# Patient Record
Sex: Female | Born: 1985
Health system: Southern US, Community
[De-identification: ages and names within clinical notes are randomized; demographics above are authoritative.]

## PROBLEM LIST (undated history)

## (undated) DIAGNOSIS — N83209 Unspecified ovarian cyst, unspecified side: Secondary | ICD-10-CM

## (undated) DIAGNOSIS — Z789 Other specified health status: Secondary | ICD-10-CM

## (undated) DIAGNOSIS — O039 Complete or unspecified spontaneous abortion without complication: Secondary | ICD-10-CM

## (undated) HISTORY — PX: BREAST BIOPSY: SHX20

---
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
## 2004-06-01 ENCOUNTER — Inpatient Hospital Stay (HOSPITAL_COMMUNITY): Admission: AD | Admit: 2004-06-01 | Discharge: 2004-06-02 | Payer: Self-pay | Admitting: Obstetrics and Gynecology

## 2004-08-07 ENCOUNTER — Other Ambulatory Visit: Admission: RE | Admit: 2004-08-07 | Discharge: 2004-08-07 | Payer: Self-pay | Admitting: Obstetrics and Gynecology

## 2004-08-21 ENCOUNTER — Ambulatory Visit (HOSPITAL_COMMUNITY): Admission: RE | Admit: 2004-08-21 | Discharge: 2004-08-21 | Payer: Self-pay | Admitting: Obstetrics and Gynecology

## 2004-10-03 ENCOUNTER — Inpatient Hospital Stay (HOSPITAL_COMMUNITY): Admission: AD | Admit: 2004-10-03 | Discharge: 2004-10-03 | Payer: Self-pay | Admitting: Obstetrics and Gynecology

## 2004-12-23 ENCOUNTER — Inpatient Hospital Stay (HOSPITAL_COMMUNITY): Admission: AD | Admit: 2004-12-23 | Discharge: 2004-12-26 | Payer: Self-pay | Admitting: Obstetrics and Gynecology

## 2004-12-28 ENCOUNTER — Ambulatory Visit: Admission: RE | Admit: 2004-12-28 | Discharge: 2004-12-28 | Payer: Self-pay | Admitting: Obstetrics and Gynecology

## 2006-01-21 ENCOUNTER — Emergency Department (HOSPITAL_COMMUNITY): Admission: EM | Admit: 2006-01-21 | Discharge: 2006-01-21 | Payer: Self-pay | Admitting: Family Medicine

## 2006-09-11 ENCOUNTER — Emergency Department (HOSPITAL_COMMUNITY): Admission: EM | Admit: 2006-09-11 | Discharge: 2006-09-11 | Payer: Self-pay | Admitting: Emergency Medicine

## 2006-12-19 ENCOUNTER — Emergency Department (HOSPITAL_COMMUNITY): Admission: EM | Admit: 2006-12-19 | Discharge: 2006-12-19 | Payer: Self-pay | Admitting: Emergency Medicine

## 2007-02-17 ENCOUNTER — Emergency Department (HOSPITAL_COMMUNITY): Admission: EM | Admit: 2007-02-17 | Discharge: 2007-02-17 | Payer: Self-pay | Admitting: Family Medicine

## 2009-02-04 ENCOUNTER — Inpatient Hospital Stay (HOSPITAL_COMMUNITY): Admission: AD | Admit: 2009-02-04 | Discharge: 2009-02-04 | Payer: Self-pay | Admitting: Obstetrics & Gynecology

## 2009-07-11 ENCOUNTER — Encounter: Admission: RE | Admit: 2009-07-11 | Discharge: 2009-07-11 | Payer: Self-pay | Admitting: Obstetrics and Gynecology

## 2009-07-14 ENCOUNTER — Encounter (INDEPENDENT_AMBULATORY_CARE_PROVIDER_SITE_OTHER): Payer: Self-pay | Admitting: Diagnostic Radiology

## 2009-07-14 ENCOUNTER — Encounter: Admission: RE | Admit: 2009-07-14 | Discharge: 2009-07-14 | Payer: Self-pay | Admitting: Obstetrics and Gynecology

## 2009-08-27 ENCOUNTER — Inpatient Hospital Stay (HOSPITAL_COMMUNITY): Admission: AD | Admit: 2009-08-27 | Discharge: 2009-08-27 | Payer: Self-pay | Admitting: Obstetrics and Gynecology

## 2009-09-12 ENCOUNTER — Inpatient Hospital Stay (HOSPITAL_COMMUNITY): Admission: AD | Admit: 2009-09-12 | Discharge: 2009-09-15 | Payer: Self-pay | Admitting: Obstetrics and Gynecology

## 2009-12-31 DIAGNOSIS — O039 Complete or unspecified spontaneous abortion without complication: Secondary | ICD-10-CM

## 2009-12-31 HISTORY — DX: Complete or unspecified spontaneous abortion without complication: O03.9

## 2010-10-02 ENCOUNTER — Inpatient Hospital Stay (HOSPITAL_COMMUNITY): Admission: AD | Admit: 2010-10-02 | Discharge: 2010-10-03 | Payer: Self-pay | Admitting: Obstetrics and Gynecology

## 2010-10-02 ENCOUNTER — Ambulatory Visit: Payer: Self-pay | Admitting: Family

## 2011-01-21 ENCOUNTER — Encounter: Payer: Self-pay | Admitting: Obstetrics and Gynecology

## 2011-02-12 ENCOUNTER — Institutional Professional Consult (permissible substitution): Payer: Self-pay | Admitting: Cardiology

## 2011-02-23 ENCOUNTER — Emergency Department (HOSPITAL_COMMUNITY)
Admission: EM | Admit: 2011-02-23 | Discharge: 2011-02-23 | Disposition: A | Discharge status: Home / Self Care | Payer: BC Managed Care – PPO | Attending: Emergency Medicine | Admitting: Emergency Medicine

## 2011-02-23 ENCOUNTER — Emergency Department (HOSPITAL_COMMUNITY): Payer: BC Managed Care – PPO

## 2011-02-23 DIAGNOSIS — R079 Chest pain, unspecified: Secondary | ICD-10-CM | POA: Insufficient documentation

## 2011-02-23 DIAGNOSIS — R002 Palpitations: Secondary | ICD-10-CM | POA: Insufficient documentation

## 2011-02-23 LAB — CBC
HCT: 37.9 % (ref 36.0–46.0)
Hemoglobin: 12.7 g/dL (ref 12.0–15.0)
MCH: 26.8 pg (ref 26.0–34.0)
MCHC: 33.5 g/dL (ref 30.0–36.0)
MCV: 80 fL (ref 78.0–100.0)
Platelets: 258 K/uL (ref 150–400)
RBC: 4.74 MIL/uL (ref 3.87–5.11)
RDW: 16 % — ABNORMAL HIGH (ref 11.5–15.5)
WBC: 8 K/uL (ref 4.0–10.5)

## 2011-02-23 LAB — POCT CARDIAC MARKERS
CKMB, poc: <1 ng/mL — ABNORMAL LOW (ref 1.0–8.0)
Myoglobin, poc: 22.6 ng/mL (ref 12–200)
Troponin i, poc: <0.05 ng/mL (ref 0.00–0.09)

## 2011-02-23 LAB — DIFFERENTIAL
Basophils Absolute: 0.1 K/uL (ref 0.0–0.1)
Basophils Relative: 1 % (ref 0–1)
Eosinophils Absolute: 0.1 10*3/uL (ref 0.0–0.7)
Eosinophils Relative: 1 % (ref 0–5)
Lymphocytes Relative: 23 % (ref 12–46)
Lymphs Abs: 1.9 K/uL (ref 0.7–4.0)
Monocytes Absolute: 0.4 10*3/uL (ref 0.1–1.0)
Monocytes Relative: 6 % (ref 3–12)
Neutro Abs: 5.6 K/uL (ref 1.7–7.7)
Neutrophils Relative %: 69 % (ref 43–77)

## 2011-02-23 LAB — TSH: TSH: 0.909 u[IU]/mL (ref 0.350–4.500)

## 2011-02-23 LAB — BASIC METABOLIC PANEL
BUN: 13 mg/dL (ref 6–23)
Calcium: 9.2 mg/dL (ref 8.4–10.5)
Creatinine, Ser: 0.68 mg/dL (ref 0.4–1.2)
GFR calc Af Amer: >60 mL/min (ref >60)
GFR calc non Af Amer: >60 mL/min (ref >60)
Glucose, Bld: 95 mg/dL (ref 70–99)

## 2011-02-23 LAB — BASIC METABOLIC PANEL WITH GFR
CO2: 27 meq/L (ref 19–32)
Chloride: 107 meq/L (ref 96–112)
Potassium: 3.7 meq/L (ref 3.5–5.1)
Sodium: 143 meq/L (ref 135–145)

## 2011-02-23 LAB — T4, FREE: Free T4: 0.98 ng/dL (ref 0.80–1.80)

## 2011-02-23 LAB — D-DIMER, QUANTITATIVE: D-Dimer, Quant: <0.22 ug{FEU}/mL (ref 0.00–0.48)

## 2011-02-23 LAB — POCT PREGNANCY, URINE: Preg Test, Ur: NEGATIVE

## 2011-03-08 ENCOUNTER — Other Ambulatory Visit: Payer: Self-pay | Admitting: Obstetrics and Gynecology

## 2011-03-14 LAB — CBC
HCT: 37.6 % (ref 36.0–46.0)
Hemoglobin: 12.6 g/dL (ref 12.0–15.0)
MCH: 29.2 pg (ref 26.0–34.0)
MCHC: 33.6 g/dL (ref 30.0–36.0)
MCV: 86.9 fL (ref 78.0–100.0)
Platelets: 189 10*3/uL (ref 150–400)
RBC: 4.33 MIL/uL (ref 3.87–5.11)
RDW: 13.2 % (ref 11.5–15.5)
WBC: 6.6 10*3/uL (ref 4.0–10.5)

## 2011-03-14 LAB — HCG, QUANTITATIVE, PREGNANCY

## 2011-03-14 LAB — RH IMMUNE GLOBULIN WORKUP (NOT WOMEN'S HOSP)
ABO/RH(D): A NEG
Antibody Screen: NEGATIVE

## 2011-03-14 LAB — ABO/RH: ABO/RH(D): A NEG

## 2011-04-06 LAB — CBC
HCT: 39.3 % (ref 36.0–46.0)
Hemoglobin: 11.9 g/dL — ABNORMAL LOW (ref 12.0–15.0)
Hemoglobin: 13 g/dL (ref 12.0–15.0)
MCHC: 33.1 g/dL (ref 30.0–36.0)
MCHC: 33.5 g/dL (ref 30.0–36.0)
Platelets: 188 10*3/uL (ref 150–400)
Platelets: 188 10*3/uL (ref 150–400)
RBC: 3.94 MIL/uL (ref 3.87–5.11)
RBC: 4.35 MIL/uL (ref 3.87–5.11)
RDW: 14.4 % (ref 11.5–15.5)

## 2011-04-06 LAB — RH IMMUNE GLOB WKUP(>/=20WKS)(NOT WOMEN'S HOSP)

## 2011-04-17 LAB — POCT PREGNANCY, URINE: Preg Test, Ur: POSITIVE

## 2011-04-17 LAB — HCG, QUANTITATIVE, PREGNANCY: hCG, Beta Chain, Quant, S: 79966 m[IU]/mL — ABNORMAL HIGH (ref <5)

## 2012-07-25 ENCOUNTER — Other Ambulatory Visit: Payer: Self-pay | Admitting: Obstetrics and Gynecology

## 2012-08-14 IMAGING — US US OB COMP LESS 14 WK
1 series · 14 of 28 positions shown · non-contrast
Comparison: 06/02/2004

CLINICAL DATA: Vaginal bleeding and evaluate for miscarriage.

OBSTETRIC <14 WK US AND TRANSVAGINAL OB US
TECHNIQUE: Both transabdominal and transvaginal ultrasound
examinations were performed for complete evaluation of the
gestation as well as the maternal uterus, adnexal regions, and
pelvic cul-de-sac.  Transvaginal technique was performed to assess
early pregnancy.

[Series 1: us ob comp less 14 wks · 0.18mm/px · 14 of 35 slices shown]
[im 2/35]
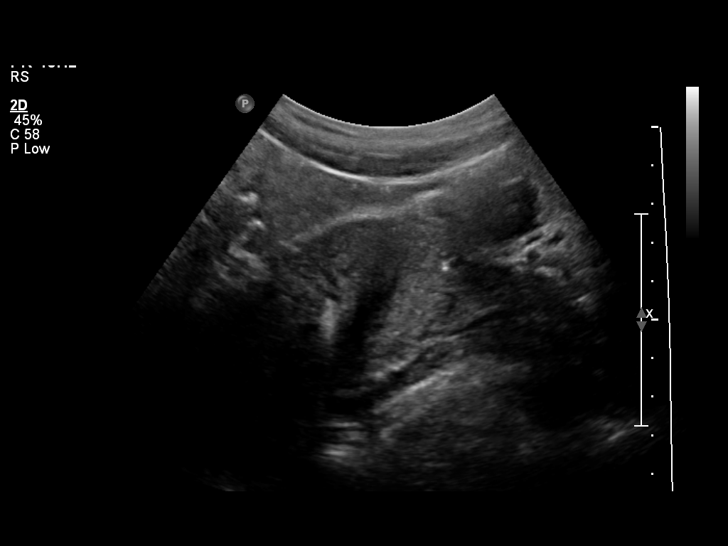
[im 4/35]
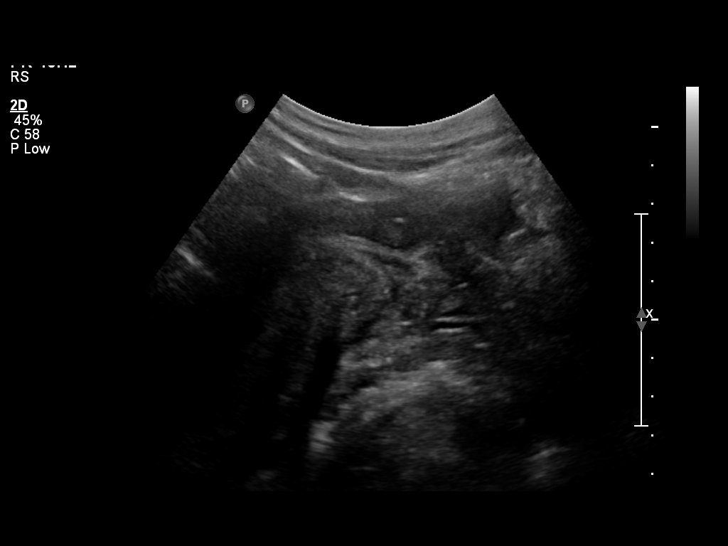
[im 7/35]
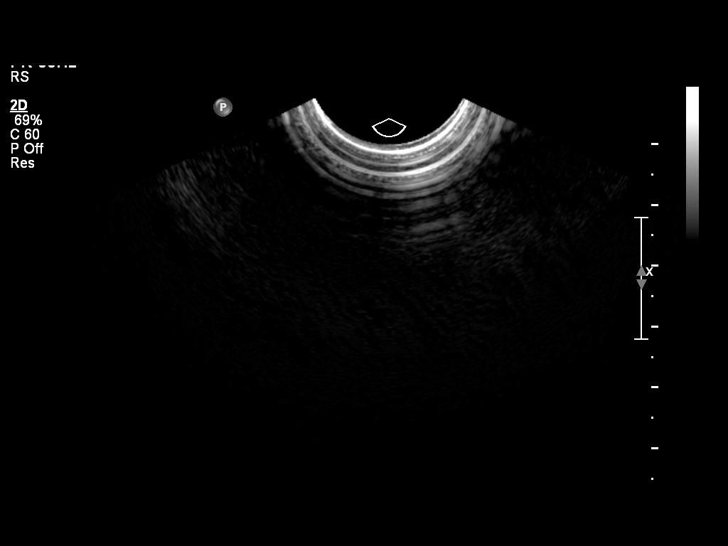
[im 9/35]
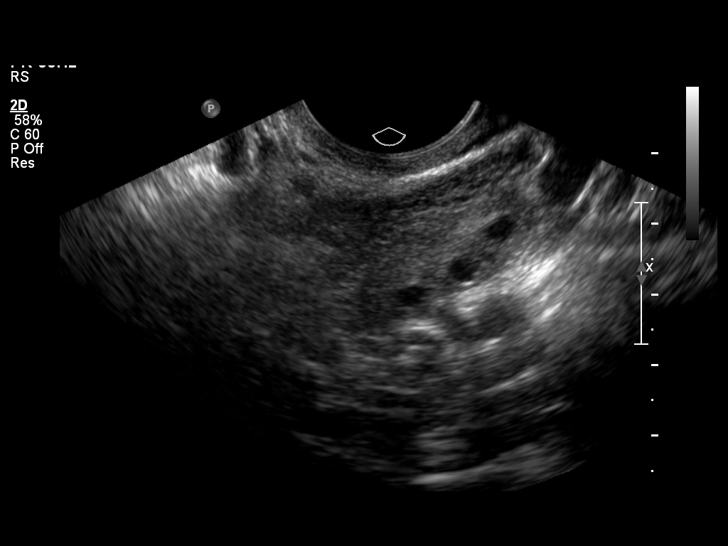
[im 12/35]
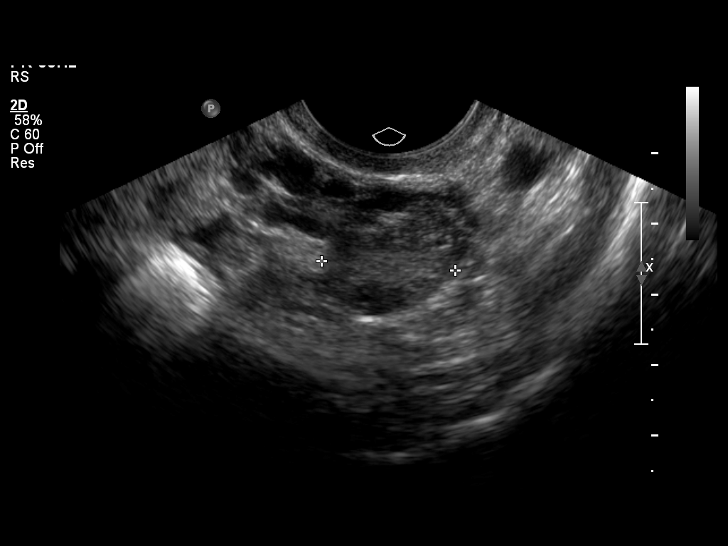
[im 14/35]
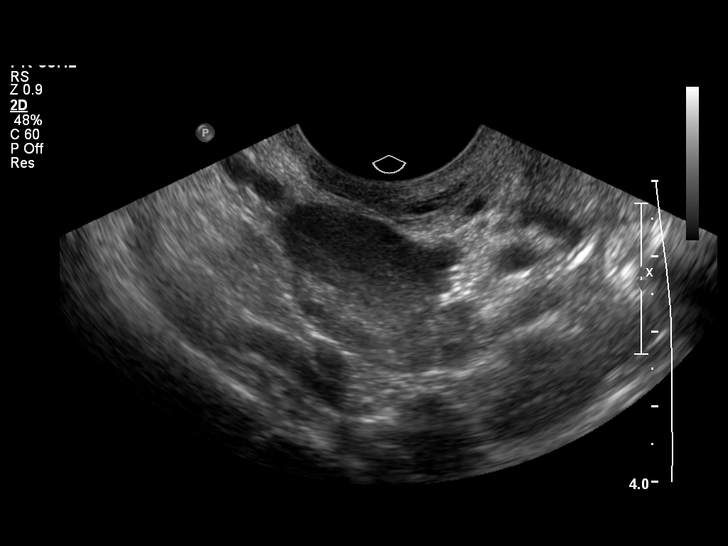
[im 17/35]
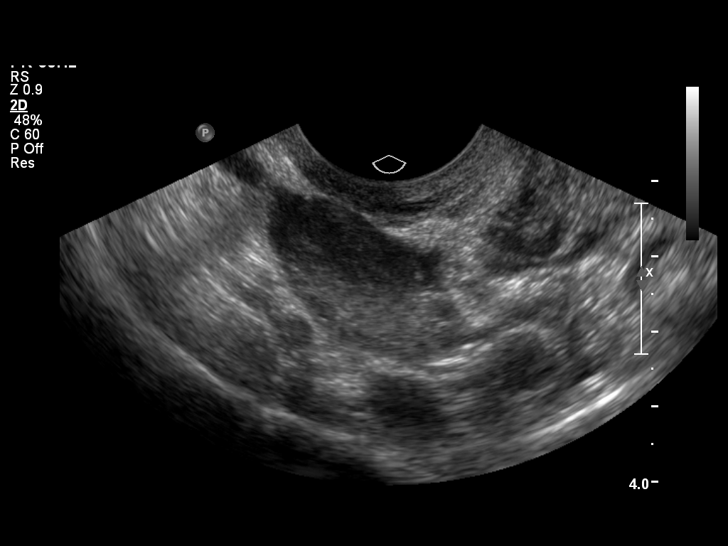
[im 19/35]
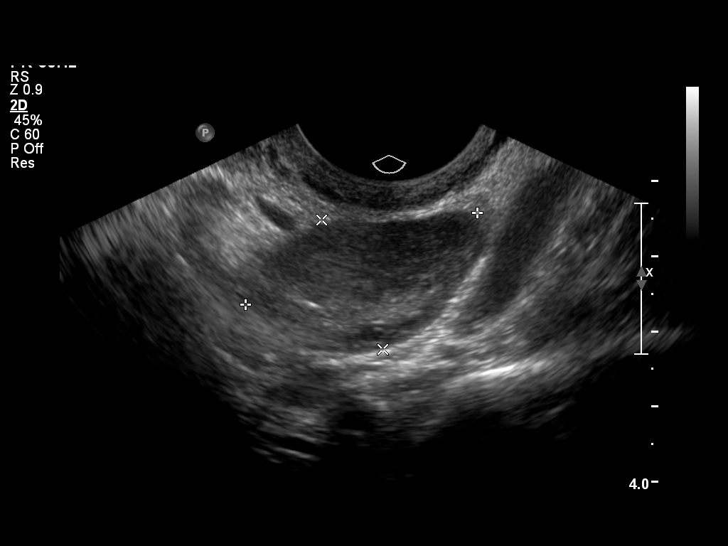
[im 22/35]
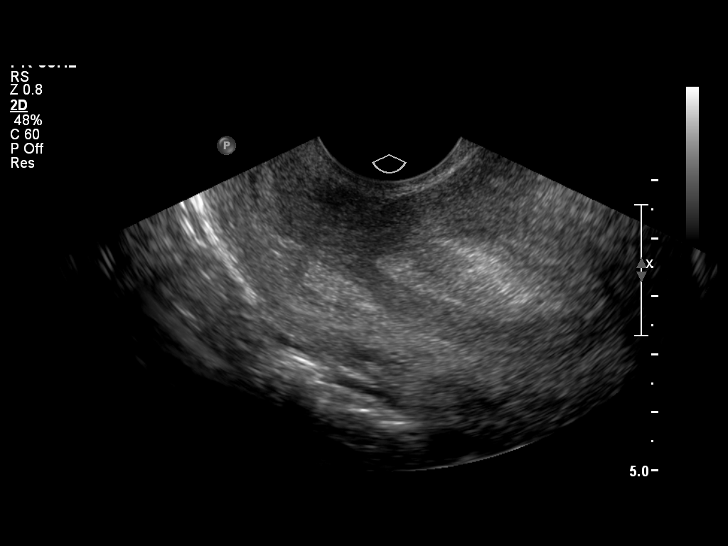
[im 24/35]
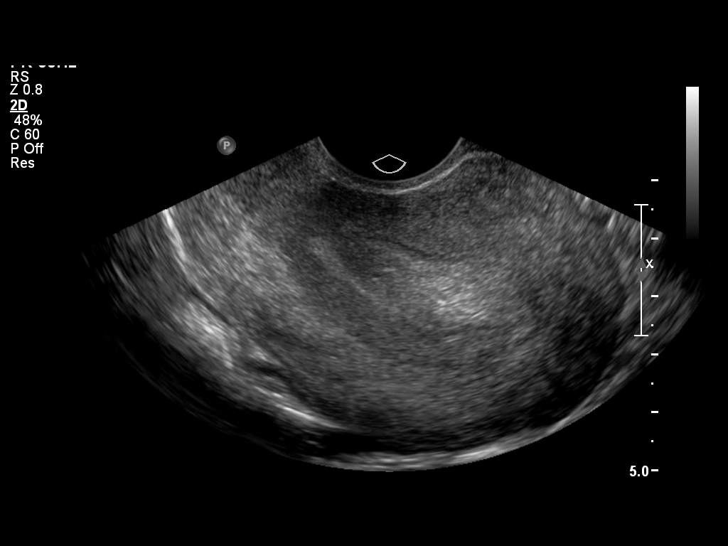
[im 27/35]
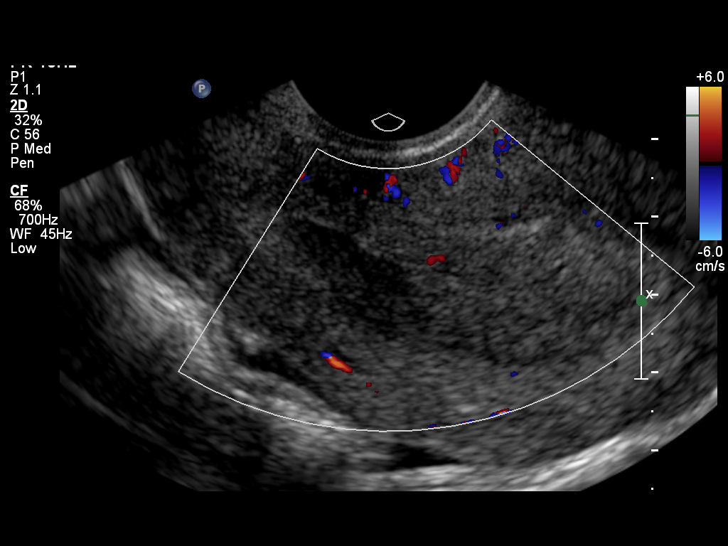
[im 29/35]
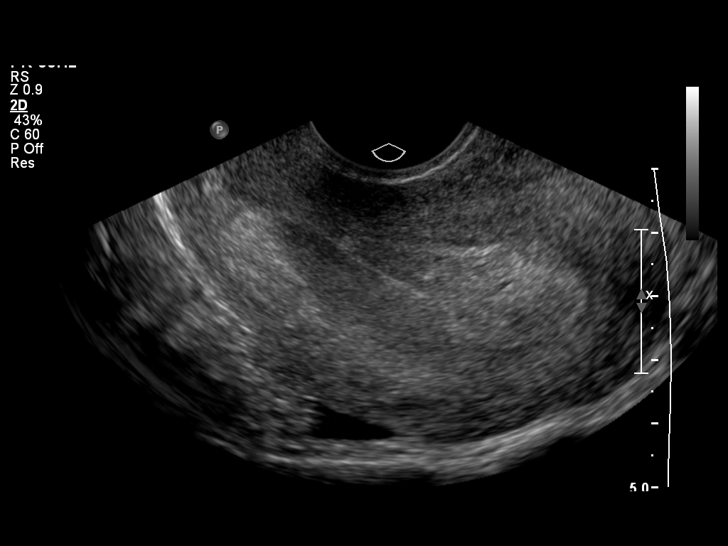
[im 32/35]
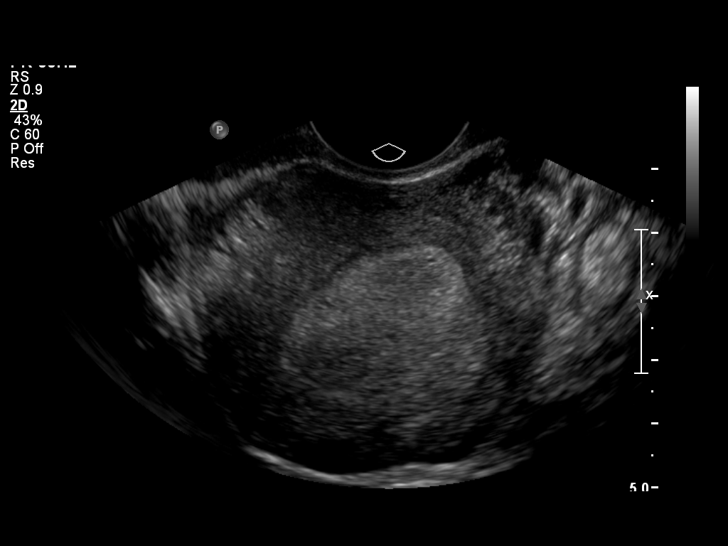
[im 35/35]
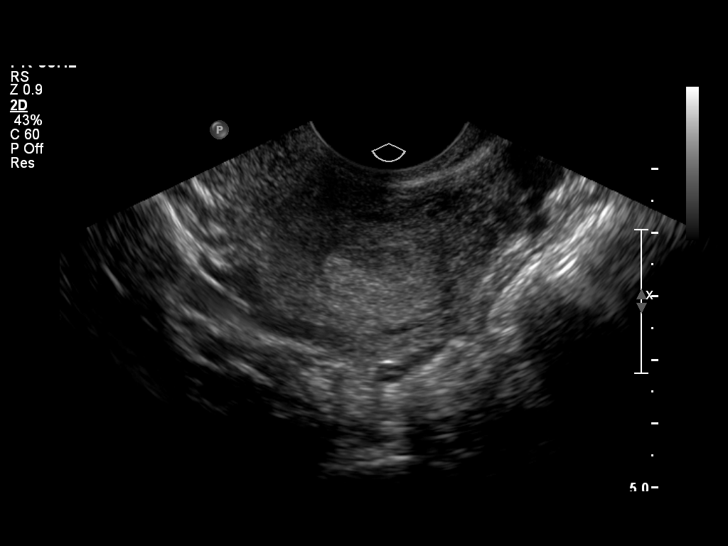

[14 of 28 positions shown; findings below may reference images not displayed]

Intrauterine gestational sac:  Not present
Yolk sac: None
Embryo: None

Maternal uterus/adnexae:
Left ovary measures 3.0 x 1.1 x 1.9 cm.  Small follicles in the
left ovary.  Right ovary measures 2.0 x 3.3 x 1.9 cm.   Uterus is
retro positioned.  Endometrium is thickened with increased
echogenicity.  Endometrium measures up to 2 cm.  No evidence for an
intrauterine gestational sac.
IMPRESSION: Endometrial thickening without evidence for an intrauterine
gestational sac.

Normal appearance of the adnexa tissue.

## 2012-08-25 ENCOUNTER — Ambulatory Visit (HOSPITAL_COMMUNITY)
Admission: AD | Admit: 2012-08-25 | Discharge: 2012-08-25 | Disposition: A | Discharge status: Home / Self Care | Payer: Medicaid Other | Source: Ambulatory Visit | Attending: Obstetrics & Gynecology | Admitting: Obstetrics & Gynecology

## 2012-08-25 ENCOUNTER — Encounter (HOSPITAL_COMMUNITY)
Admission: AD | Disposition: A | Discharge status: Home / Self Care | Payer: Self-pay | Source: Ambulatory Visit | Attending: Obstetrics & Gynecology

## 2012-08-25 ENCOUNTER — Encounter (HOSPITAL_COMMUNITY): Payer: Self-pay | Admitting: *Deleted

## 2012-08-25 ENCOUNTER — Ambulatory Visit (HOSPITAL_COMMUNITY): Payer: Medicaid Other | Admitting: Anesthesiology

## 2012-08-25 ENCOUNTER — Encounter (HOSPITAL_COMMUNITY): Payer: Self-pay | Admitting: Anesthesiology

## 2012-08-25 DIAGNOSIS — O021 Missed abortion: Secondary | ICD-10-CM | POA: Insufficient documentation

## 2012-08-25 HISTORY — PX: DILATION AND EVACUATION: SHX1459

## 2012-08-25 HISTORY — DX: Complete or unspecified spontaneous abortion without complication: O03.9

## 2012-08-25 HISTORY — DX: Other specified health status: Z78.9

## 2012-08-25 LAB — CBC
Hemoglobin: 13.6 g/dL (ref 12.0–15.0)
MCH: 28.1 pg (ref 26.0–34.0)
MCHC: 33.3 g/dL (ref 30.0–36.0)
MCV: 84.5 fL (ref 78.0–100.0)

## 2012-08-25 SURGERY — DILATION AND EVACUATION, UTERUS
Anesthesia: Monitor Anesthesia Care | Site: Uterus | Wound class: Clean Contaminated

## 2012-08-25 MED ORDER — LACTATED RINGERS IV SOLN
INTRAVENOUS | Status: DC
  Administered 2012-08-25 (×2): via INTRAVENOUS

## 2012-08-25 MED ORDER — MIDAZOLAM HCL 5 MG/5ML IJ SOLN
INTRAMUSCULAR | Status: DC | PRN
  Administered 2012-08-25: 2 mg via INTRAVENOUS

## 2012-08-25 MED ORDER — PROPOFOL 10 MG/ML IV EMUL
INTRAVENOUS | Status: DC | PRN
  Administered 2012-08-25 (×2): 20 mg via INTRAVENOUS
  Administered 2012-08-25: 10 mg via INTRAVENOUS

## 2012-08-25 MED ORDER — DEXAMETHASONE SODIUM PHOSPHATE 10 MG/ML IJ SOLN
INTRAMUSCULAR | Status: AC
  Filled 2012-08-25: qty 1

## 2012-08-25 MED ORDER — ONDANSETRON HCL 4 MG/2ML IJ SOLN
INTRAMUSCULAR | Status: DC | PRN
  Administered 2012-08-25: 4 mg via INTRAVENOUS

## 2012-08-25 MED ORDER — MIDAZOLAM HCL 2 MG/2ML IJ SOLN
INTRAMUSCULAR | Status: AC
  Filled 2012-08-25: qty 2

## 2012-08-25 MED ORDER — FENTANYL CITRATE 0.05 MG/ML IJ SOLN
INTRAMUSCULAR | Status: DC | PRN
  Administered 2012-08-25 (×2): 50 ug via INTRAVENOUS
  Administered 2012-08-25: 100 ug via INTRAVENOUS

## 2012-08-25 MED ORDER — KETOROLAC TROMETHAMINE 60 MG/2ML IM SOLN
INTRAMUSCULAR | Status: DC | PRN
  Administered 2012-08-25: 30 mg via INTRAMUSCULAR

## 2012-08-25 MED ORDER — CEFAZOLIN SODIUM-DEXTROSE 2-3 GM-% IV SOLR: 2.0000 g | INTRAVENOUS | Status: DC

## 2012-08-25 MED ORDER — FENTANYL CITRATE 0.05 MG/ML IJ SOLN: 25.0000 ug | INTRAMUSCULAR | Status: DC | PRN

## 2012-08-25 MED ORDER — RHO D IMMUNE GLOBULIN 1500 UNIT/2ML IJ SOLN
300.0000 ug | Freq: Once | INTRAMUSCULAR | Status: AC
  Administered 2012-08-25: 300 ug via INTRAVENOUS
  Filled 2012-08-25: qty 2

## 2012-08-25 MED ORDER — LIDOCAINE HCL 2 % IJ SOLN
INTRAMUSCULAR | Status: DC | PRN
  Administered 2012-08-25: 20 mL

## 2012-08-25 MED ORDER — ONDANSETRON HCL 4 MG/2ML IJ SOLN
INTRAMUSCULAR | Status: AC
  Filled 2012-08-25: qty 2

## 2012-08-25 MED ORDER — DEXAMETHASONE SODIUM PHOSPHATE 10 MG/ML IJ SOLN
INTRAMUSCULAR | Status: DC | PRN
  Administered 2012-08-25: 10 mg via INTRAVENOUS

## 2012-08-25 MED ORDER — KETOROLAC TROMETHAMINE 60 MG/2ML IM SOLN
INTRAMUSCULAR | Status: AC
  Filled 2012-08-25: qty 2

## 2012-08-25 MED ORDER — LIDOCAINE HCL (CARDIAC) 20 MG/ML IV SOLN
INTRAVENOUS | Status: AC
  Filled 2012-08-25: qty 5

## 2012-08-25 MED ORDER — KETOROLAC TROMETHAMINE 30 MG/ML IJ SOLN
INTRAMUSCULAR | Status: DC | PRN
  Administered 2012-08-25: 30 mg via INTRAVENOUS

## 2012-08-25 MED ORDER — RHO D IMMUNE GLOBULIN 1500 UNIT/2ML IJ SOLN
300.0000 ug | Freq: Once | INTRAMUSCULAR | Status: AC
  Filled 2012-08-25: qty 2

## 2012-08-25 MED ORDER — PROPOFOL INFUSION 10 MG/ML OPTIME
INTRAVENOUS | Status: DC | PRN
  Administered 2012-08-25: 100 ug/kg/min via INTRAVENOUS

## 2012-08-25 MED ORDER — FENTANYL CITRATE 0.05 MG/ML IJ SOLN
INTRAMUSCULAR | Status: AC
  Filled 2012-08-25: qty 4

## 2012-08-25 MED ORDER — LIDOCAINE HCL 2 % IJ SOLN
INTRAMUSCULAR | Status: AC
  Filled 2012-08-25: qty 1

## 2012-08-25 MED ORDER — PROPOFOL 10 MG/ML IV EMUL
INTRAVENOUS | Status: AC
  Filled 2012-08-25: qty 40

## 2012-08-25 MED ORDER — LIDOCAINE HCL (CARDIAC) 20 MG/ML IV SOLN
INTRAVENOUS | Status: DC | PRN
  Administered 2012-08-25: 60 mg via INTRAVENOUS

## 2012-08-25 MED ORDER — CEFAZOLIN SODIUM-DEXTROSE 2-3 GM-% IV SOLR
INTRAVENOUS | Status: AC
  Administered 2012-08-25: 2 g via INTRAVENOUS
  Filled 2012-08-25: qty 50

## 2012-08-25 SURGICAL SUPPLY — 17 items
CATH ROBINSON RED A/P 16FR (CATHETERS) IMPLANT
CLOTH BEACON ORANGE TIMEOUT ST (SAFETY) ×2 IMPLANT
DECANTER SPIKE VIAL GLASS SM (MISCELLANEOUS) ×2 IMPLANT
GLOVE ECLIPSE 6.0 STRL STRAW (GLOVE) ×4 IMPLANT
GOWN PREVENTION PLUS LG XLONG (DISPOSABLE) ×2 IMPLANT
KIT BERKELEY 1ST TRIMESTER 3/8 (MISCELLANEOUS) ×2 IMPLANT
NEEDLE SPNL 22GX3.5 QUINCKE BK (NEEDLE) ×2 IMPLANT
NS IRRIG 1000ML POUR BTL (IV SOLUTION) ×2 IMPLANT
PACK VAGINAL MINOR WOMEN LF (CUSTOM PROCEDURE TRAY) ×2 IMPLANT
PAD PREP 24X48 CUFFED NSTRL (MISCELLANEOUS) ×2 IMPLANT
SET BERKELEY SUCTION TUBING (SUCTIONS) ×2 IMPLANT
SYR CONTROL 10ML LL (SYRINGE) ×2 IMPLANT
TOWEL OR 17X24 6PK STRL BLUE (TOWEL DISPOSABLE) ×4 IMPLANT
VACURETTE 10 RIGID CVD (CANNULA) IMPLANT
VACURETTE 7MM CVD STRL WRAP (CANNULA) IMPLANT
VACURETTE 8 RIGID CVD (CANNULA) IMPLANT
VACURETTE 9 RIGID CVD (CANNULA) IMPLANT

## 2012-08-25 NOTE — Anesthesia Preprocedure Evaluation (Signed)
Anesthesia Evaluation  Patient identified by MRN, date of birth, ID band Patient awake    Reviewed: Allergy & Precautions, H&P , Patient's Chart, lab work & pertinent test results, reviewed documented beta blocker date and time   Airway Mallampati: II TM Distance: >3 FB Neck ROM: full    Dental No notable dental hx.    Pulmonary  breath sounds clear to auscultation  Pulmonary exam normal       Cardiovascular Rhythm:regular Rate:Normal     Neuro/Psych    GI/Hepatic   Endo/Other    Renal/GU      Musculoskeletal   Abdominal   Peds  Hematology   Anesthesia Other Findings   Reproductive/Obstetrics                           Anesthesia Physical Anesthesia Plan  ASA: II  Anesthesia Plan: General and MAC   Post-op Pain Management:    Induction: Intravenous  Airway Management Planned: LMA, Mask and Simple Face Mask  Additional Equipment:   Intra-op Plan:   Post-operative Plan:   Informed Consent: I have reviewed the patients History and Physical, chart, labs and discussed the procedure including the risks, benefits and alternatives for the proposed anesthesia with the patient or authorized representative who has indicated his/her understanding and acceptance.   Dental Advisory Given  Plan Discussed with: CRNA and Surgeon  Anesthesia Plan Comments: (  Discussed LMA vs general anesthesia, including possible nausea, instrumentation of airway, sore throat,pulmonary aspiration, etc. I asked if the were any outstanding questions, or  concerns before we proceeded. )        Anesthesia Quick Evaluation

## 2012-08-25 NOTE — Discharge Instructions (Addendum)
°  Do not take ibuprofen containing products (ie Advil, Aleve, Motrin, etc..) until after 10:30 pm this evening.   DISCHARGE INSTRUCTIONS: D&C / D&E The following instructions have been prepared to help you care for yourself upon your return home.   Personal hygiene:  Use sanitary pads for vaginal drainage, not tampons.  Shower the day after your procedure.  NO tub baths, pools or Jacuzzis for 2-3 weeks.  Wipe front to back after using the bathroom.  Activity and limitations:  Do NOT drive or operate any equipment for 24 hours. The effects of anesthesia are still present and drowsiness may result.  Do NOT rest in bed all day.  Walking is encouraged.  Walk up and down stairs slowly.  You may resume your normal activity in one to two days or as indicated by your physician.  Sexual activity: NO intercourse for at least 2 weeks after the procedure, or as indicated by your physician.  Diet: Eat a light meal as desired this evening. You may resume your usual diet tomorrow.  Return to work: You may resume your work activities in one to two days or as indicated by your doctor.  What to expect after your surgery: Expect to have vaginal bleeding/discharge for 2-3 days and spotting for up to 10 days. It is not unusual to have soreness for up to 1-2 weeks. You may have a slight burning sensation when you urinate for the first day. Mild cramps may continue for a couple of days. You may have a regular period in 2-6 weeks.  Call your doctor for any of the following:  Excessive vaginal bleeding, saturating and changing one pad every hour.  Inability to urinate 6 hours after discharge from hospital.  Pain not relieved by pain medication.  Fever of 100.4 F or greater.  Unusual vaginal discharge or odor.   Call for an appointment: Schedule for two week follow-up.    Patients signature: ______________________  Nurses signature ________________________  Post Anesthesia Care Unit  [phone]

## 2012-08-25 NOTE — Anesthesia Postprocedure Evaluation (Signed)
Anesthesia Post Note  Patient: Carrie Nunez  Procedure(s) Performed: Procedure(s) (LRB): DILATATION AND EVACUATION (N/A)  Anesthesia type: MAC  Patient location: PACU  Post pain: Pain level controlled  Post assessment: Post-op Vital signs reviewed  Last Vitals:  Filed Vitals:   08/25/12 1715  BP: 105/42  Pulse: 82  Temp: 36.4 C  Resp: 18    Post vital signs: Reviewed  Level of consciousness: sedated  Complications: No apparent anesthesia complications

## 2012-08-25 NOTE — Transfer of Care (Signed)
Immediate Anesthesia Transfer of Care Note  Patient: Carrie Nunez  Procedure(s) Performed: Procedure(s) (LRB): DILATATION AND EVACUATION (N/A)  Patient Location: PACU  Anesthesia Type: MAC  Level of Consciousness: awake, alert  and oriented  Airway & Oxygen Therapy: Patient Spontanous Breathing  Post-op Assessment: Report given to PACU RN and Post -op Vital signs reviewed and stable  Post vital signs: Reviewed and stable  Complications: No apparent anesthesia complications

## 2012-08-25 NOTE — H&P (Signed)
26 year old G 4 P 2012 who is admitted for D&E for fetal demise at [redacted] weeks gestation.  She was seen 4 weeks ago and ultrasound showed a living fetus at 7 weeks.  A small right Dermoid cyst was noted.  She presents today with pelvic pain and dark spotting.  Ultrasound showed 9 week fetal pole ([redacted] week gestation) with no fetal heart activity.  PMH:  Rh negative, no surgeries, no medical illnesses, Low risk penicillin allergy.  FH, SH, ROS: non-contributory.  Afebrile, VSS Uterus: retroverted, 10 week sized. Cervix: closed  Impression:  Missed Ab.  Plan:  D&E (Patient request - discussed Cytotec to induce spontaneous ab).

## 2012-08-25 NOTE — Op Note (Signed)
Patient Name: Carrie Nunez MRN: [id number]  Date of Surgery: 08/25/2012    PREOPERATIVE DIAGNOSIS: miss ab  POSTOPERATIVE DIAGNOSIS: miss ab   PROCEDURE: D&E  SURGEON: Caralyn Guile. Arlyce Dice M.D.  ANESTHESIA: Paracervical block; IV sedation  ESTIMATED BLOOD LOSS: 30 ml  FINDINGS: POC consistent with 9 week fetal demise   INDICATIONS: Ultrasound today confirmed fetal demise at 9 weeks.  PROCEDURE IN DETAIL: The patient was taken to the OR and placed in the dors-lithotomy position. The perineum and vagina were prepped and draped in a sterile fashion. Bimanual exam revealed a retroverted,9 week sized uterus. 10 ml of 2% lidocaine was infiltrated in the paracervical tissue and Pratt dilators were used to open the cervix to 83 [place]. A 9 mm suction curette was introduced into the endometrial cavity and suction curettage was carried out until the products of conception were felt to be completely evacuated. The procedure was then terminated and the patient left the operating room in good condition.

## 2012-08-26 ENCOUNTER — Encounter (HOSPITAL_COMMUNITY): Payer: Self-pay | Admitting: Obstetrics & Gynecology

## 2012-08-26 LAB — RH IG WORKUP (INCLUDES ABO/RH)
ABO/RH(D): A NEG
Unit division: 0

## 2012-09-21 ENCOUNTER — Encounter (HOSPITAL_COMMUNITY): Payer: Self-pay | Admitting: *Deleted

## 2012-09-21 ENCOUNTER — Emergency Department (HOSPITAL_COMMUNITY)
Admission: EM | Admit: 2012-09-21 | Discharge: 2012-09-21 | Disposition: A | Discharge status: Home / Self Care | Payer: Medicaid Other | Source: Home / Self Care | Attending: Emergency Medicine | Admitting: Emergency Medicine

## 2012-09-21 DIAGNOSIS — N390 Urinary tract infection, site not specified: Secondary | ICD-10-CM

## 2012-09-21 HISTORY — DX: Unspecified ovarian cyst, unspecified side: N83.209

## 2012-09-21 LAB — POCT URINALYSIS DIP (DEVICE)
Glucose, UA: 250 mg/dL — AB
Hgb urine dipstick: NEGATIVE
Nitrite: POSITIVE — AB
Urobilinogen, UA: 4 mg/dL — ABNORMAL HIGH (ref 0.0–1.0)
pH: 5 (ref 5.0–8.0)

## 2012-09-21 MED ORDER — CEPHALEXIN 500 MG PO CAPS: 500.0000 mg | ORAL_CAPSULE | Freq: Four times a day (QID) | ORAL | Status: AC

## 2012-09-21 NOTE — ED Notes (Signed)
Reports being out of town when she started w/ UTI sxs 1 wk ago - went to an urgent care and was given Rx for amoxicillin.  Has been taking amoxicillin as directed, but has no relief, and is now starting to get pain across lower back.  C/O dysuria, urinary urgency and frequent urination.  Denies fevers, n/v.  Had D&C 3 wks ago.  Has also started taking AZO.

## 2012-09-21 NOTE — ED Provider Notes (Signed)
History     CSN: [id number]  Arrival date & time 09/21/12  1253   First MD Initiated Contact with Patient 09/21/12 1304      Chief Complaint  Patient presents with  . Urinary Tract Infection    (Consider location/radiation/quality/duration/timing/severity/associated sxs/prior treatment) HPI Comments: Patient presents urgent care describing that she's been having pressure and burning with urination for about a week. She went to another urgent care facility and was prescribed amoxicillin after being informed that she had a urinary tract infection. She did not experience any improvement describe some discomfort in her lower back. Currently continues experiencing increased frequency burning and pain with urination. Denies any fevers, nausea vomiting or flank pain. Patient also described that she had a D. and C3 weeks ago, describing did well after procedure, and to the symptoms started about a week ago.  Patient is a 26 y.o. female presenting with urinary tract infection. The history is provided by the patient.  Urinary Tract Infection This is a new problem. The current episode started more than 1 week ago. The problem occurs constantly. The problem has not changed since onset.Associated symptoms include abdominal pain and shortness of breath. Nothing aggravates the symptoms. Treatments tried: Amoxicillin. The treatment provided no relief.    Past Medical History  Diagnosis Date  . No pertinent past medical history   . Abortion, spontaneous 2011  . Ovarian cyst     Past Surgical History  Procedure Date  . Vaginal delivery 2005. 2010  . Dilation and evacuation 08/25/2012    Procedure: DILATATION AND EVACUATION;  Surgeon: Mickel Baas, MD;  Location: [facility];  Service: Gynecology;  Laterality: N/A;    No family history on file.  History  Substance Use Topics  . Smoking status: Not on file  . Smokeless tobacco: Not on file  . Alcohol Use: No    OB History    Grav Para Term  Preterm Abortions TAB SAB Ect Mult Living                  Review of Systems  Constitutional: Negative for fever, chills, activity change, appetite change and unexpected weight change.  Respiratory: Positive for shortness of breath.   Gastrointestinal: Positive for abdominal pain. Negative for nausea, vomiting, diarrhea, constipation and blood in stool.  Genitourinary: Positive for dysuria, urgency, frequency and pelvic pain. Negative for hematuria, flank pain, vaginal bleeding, vaginal discharge, enuresis, genital sores and vaginal pain.    Allergies  Penicillins  Home Medications   Current Outpatient Rx  Name Route Sig Dispense Refill  . ACETAMINOPHEN 500 MG PO TABS Oral Take 500 mg by mouth every 6 (six) hours as needed. For pain    . CEPHALEXIN 500 MG PO CAPS Oral Take 1 capsule (500 mg total) by mouth 4 (four) times daily. 28 capsule 0  . PRENATAL MULTIVITAMIN CH Oral Take 1 tablet by mouth daily.      BP 114/65  Pulse 69  Temp 98.3 F (36.8 C) (Oral)  Resp 18  SpO2 100%  Physical Exam  Nursing note and vitals reviewed. Constitutional: She appears well-developed and well-nourished.  Abdominal: Soft. She exhibits no distension and no mass. There is no hepatosplenomegaly or hepatomegaly. There is tenderness in the suprapubic area. There is no rigidity, no rebound, no guarding, no CVA tenderness, no tenderness at McBurney's point and negative Murphy's sign.      ED Course  Procedures (including critical care time)  Labs Reviewed  POCT URINALYSIS DIP (DEVICE) -  Abnormal; Notable for the following:    Glucose, UA 250 (*)     Bilirubin Urine MODERATE (*)     Ketones, ur 15 (*)     Protein, ur 100 (*)     Urobilinogen, UA 4.0 (*)     Nitrite POSITIVE (*)     Leukocytes, UA LARGE (*)  Biochemical Testing Only. Please order routine urinalysis from main lab if confirmatory testing is needed.   All other components within normal limits  POCT PREGNANCY, URINE  URINE  CULTURE   No results found.   1. Urinary tract infection       MDM   Symptoms and exam consistent with a uncomplicated urinary tract infection. Patient has been started on Keflex to be used every 6 hours for the next 48 hours and then to switch every 8 hours for a total of 7 days. We have discussed symptoms that would should warrant further evaluation in the emergency department.   Jimmie Molly, MD 09/21/12 [phone]

## 2012-09-23 LAB — URINE CULTURE

## 2012-10-02 ENCOUNTER — Telehealth (HOSPITAL_COMMUNITY): Payer: Self-pay | Admitting: *Deleted

## 2012-10-02 NOTE — ED Notes (Signed)
Pt. called and said she took Keflex for UTI.   Does not think it went completely away.  Denies fever, vomiting or abdominal pain.  C/o burning with urintation.  I told her I would call back. Discussed with Dr. Ladon Applebaum. Urine culture reviewed. It showed 6,000 colonies -insignificant growth. He said to tell pt. To f/u in the ED for further testing.  She had sx.'s for 1 week prior to coming in and has had 1 week of tx.  I called pt. and gave her the results.  She said she is 2 weeks post D and C.  I told her the doctor said to f/u in the ED. She did not want to go there.  I told her she could [facility].  Vassie Moselle 10/02/2012

## 2012-12-31 NOTE — L&D Delivery Note (Signed)
Patient was C/C/+3 and pushed for 10 minutes with epidural.   NSVD  female infant, Apgars 9,9, weight P.   The patient had one second degree labial lacerations repaired with 2-0 vicryl R. Fundus was firm. EBL was expected. Placenta was delivered intact. Vagina was clear.  Baby was vigorous to bedside.  Carrie Nunez

## 2013-03-02 LAB — OB RESULTS CONSOLE RPR: RPR: NONREACTIVE

## 2013-03-02 LAB — OB RESULTS CONSOLE GC/CHLAMYDIA
Chlamydia: NEGATIVE
Gonorrhea: NEGATIVE

## 2013-03-02 LAB — OB RESULTS CONSOLE ANTIBODY SCREEN: Antibody Screen: NEGATIVE

## 2013-03-02 LAB — OB RESULTS CONSOLE HEPATITIS B SURFACE ANTIGEN: Hepatitis B Surface Ag: NEGATIVE

## 2013-03-02 LAB — OB RESULTS CONSOLE RUBELLA ANTIBODY, IGM: Rubella: IMMUNE

## 2013-09-15 ENCOUNTER — Encounter (HOSPITAL_COMMUNITY): Payer: Self-pay | Admitting: *Deleted

## 2013-09-15 ENCOUNTER — Inpatient Hospital Stay (HOSPITAL_COMMUNITY)
Admission: AD | Admit: 2013-09-15 | Discharge: 2013-09-15 | Disposition: A | Discharge status: Home / Self Care | Payer: Medicaid Other | Source: Ambulatory Visit | Attending: Obstetrics and Gynecology | Admitting: Obstetrics and Gynecology

## 2013-09-15 NOTE — MAU Note (Signed)
Pt presents with c/o contractions described as "tightening" and reduced fetal movt.  Denies leaking of fluid or vaginal bleeding.  Pt denies problems with pregnancy.

## 2013-09-15 NOTE — MAU Note (Signed)
Pt states she has had ongoing uc's, more tightness today, denies bleeding or LOF.  Decreased FM.

## 2013-09-15 NOTE — Progress Notes (Signed)
Dr. Henderson Cloud notified of pt complaints.  Notified that pt has been on monitor for 35+ minutes and FHR reactive with ctxs q7 minutes that palpate mild.  Notified that SVE 1-2/50/-2.  Orders received to d/c pt to home.

## 2013-09-15 NOTE — Progress Notes (Signed)
Pt. Discharged to home with family.  Voices understanding with all discharge teaching. Will return or call office for leaking fluid, vaginal bleeding, progressive painful ctxs, or for any concerns about her baby.  To follow up with provider on Thursday.

## 2013-09-17 ENCOUNTER — Inpatient Hospital Stay (HOSPITAL_COMMUNITY)
Admission: AD | Admit: 2013-09-17 | Discharge: 2013-09-19 | DRG: 775 | Disposition: A | Discharge status: Home / Self Care | Payer: 59 | Source: Ambulatory Visit | Attending: Obstetrics and Gynecology | Admitting: Obstetrics and Gynecology

## 2013-09-17 ENCOUNTER — Encounter (HOSPITAL_COMMUNITY): Payer: Self-pay | Admitting: *Deleted

## 2013-09-17 ENCOUNTER — Inpatient Hospital Stay (HOSPITAL_COMMUNITY): Payer: 59 | Admitting: Anesthesiology

## 2013-09-17 ENCOUNTER — Encounter (HOSPITAL_COMMUNITY): Payer: Self-pay | Admitting: Anesthesiology

## 2013-09-17 HISTORY — DX: Other specified health status: Z78.9

## 2013-09-17 LAB — CBC
HCT: 38 % (ref 36.0–46.0)
Hemoglobin: 12.9 g/dL (ref 12.0–15.0)
MCH: 28.4 pg (ref 26.0–34.0)
MCV: 83.7 fL (ref 78.0–100.0)
RBC: 4.54 MIL/uL (ref 3.87–5.11)
WBC: 16.4 10*3/uL — ABNORMAL HIGH (ref 4.0–10.5)

## 2013-09-17 MED ORDER — IBUPROFEN 600 MG PO TABS: 600.0000 mg | ORAL_TABLET | Freq: Four times a day (QID) | ORAL | Status: DC | PRN

## 2013-09-17 MED ORDER — LACTATED RINGERS IV SOLN
INTRAVENOUS | Status: DC
  Administered 2013-09-17 (×2): via INTRAVENOUS

## 2013-09-17 MED ORDER — ACETAMINOPHEN 325 MG PO TABS: 650.0000 mg | ORAL_TABLET | ORAL | Status: DC | PRN

## 2013-09-17 MED ORDER — EPHEDRINE 5 MG/ML INJ
10.0000 mg | INTRAVENOUS | Status: DC | PRN
  Filled 2013-09-17: qty 2

## 2013-09-17 MED ORDER — CITRIC ACID-SODIUM CITRATE 334-500 MG/5ML PO SOLN: 30.0000 mL | ORAL | Status: DC | PRN

## 2013-09-17 MED ORDER — OXYCODONE-ACETAMINOPHEN 5-325 MG PO TABS: 1.0000 | ORAL_TABLET | ORAL | Status: DC | PRN

## 2013-09-17 MED ORDER — LACTATED RINGERS IV SOLN
500.0000 mL | Freq: Once | INTRAVENOUS | Status: AC
  Administered 2013-09-17: 500 mL via INTRAVENOUS

## 2013-09-17 MED ORDER — LACTATED RINGERS IV SOLN: 500.0000 mL | INTRAVENOUS | Status: DC | PRN

## 2013-09-17 MED ORDER — OXYTOCIN BOLUS FROM INFUSION
500.0000 mL | INTRAVENOUS | Status: DC
  Administered 2013-09-18: 500 mL via INTRAVENOUS

## 2013-09-17 MED ORDER — SODIUM BICARBONATE 8.4 % IV SOLN
INTRAVENOUS | Status: DC | PRN
  Administered 2013-09-17: 4 mL via EPIDURAL
  Administered 2013-09-17: 3 mL via EPIDURAL

## 2013-09-17 MED ORDER — EPHEDRINE 5 MG/ML INJ
10.0000 mg | INTRAVENOUS | Status: DC | PRN
  Filled 2013-09-17: qty 4
  Filled 2013-09-17: qty 2

## 2013-09-17 MED ORDER — PHENYLEPHRINE 40 MCG/ML (10ML) SYRINGE FOR IV PUSH (FOR BLOOD PRESSURE SUPPORT)
80.0000 ug | PREFILLED_SYRINGE | INTRAVENOUS | Status: DC | PRN
  Filled 2013-09-17: qty 2
  Filled 2013-09-17: qty 5

## 2013-09-17 MED ORDER — ONDANSETRON HCL 4 MG/2ML IJ SOLN
4.0000 mg | Freq: Four times a day (QID) | INTRAMUSCULAR | Status: DC | PRN
  Administered 2013-09-17: 4 mg via INTRAVENOUS
  Filled 2013-09-17: qty 2

## 2013-09-17 MED ORDER — FLEET ENEMA 7-19 GM/118ML RE ENEM: 1.0000 | ENEMA | RECTAL | Status: DC | PRN

## 2013-09-17 MED ORDER — PHENYLEPHRINE 40 MCG/ML (10ML) SYRINGE FOR IV PUSH (FOR BLOOD PRESSURE SUPPORT)
80.0000 ug | PREFILLED_SYRINGE | INTRAVENOUS | Status: DC | PRN
  Filled 2013-09-17: qty 2

## 2013-09-17 MED ORDER — DIPHENHYDRAMINE HCL 50 MG/ML IJ SOLN: 12.5000 mg | INTRAMUSCULAR | Status: DC | PRN

## 2013-09-17 MED ORDER — LIDOCAINE HCL (PF) 1 % IJ SOLN
30.0000 mL | INTRAMUSCULAR | Status: DC | PRN
  Filled 2013-09-17 (×2): qty 30

## 2013-09-17 MED ORDER — FENTANYL 2.5 MCG/ML BUPIVACAINE 1/10 % EPIDURAL INFUSION (WH - ANES)
14.0000 mL/h | INTRAMUSCULAR | Status: DC | PRN
  Filled 2013-09-17: qty 125

## 2013-09-17 MED ORDER — OXYTOCIN 40 UNITS IN LACTATED RINGERS INFUSION - SIMPLE MED
62.5000 mL/h | INTRAVENOUS | Status: DC
  Filled 2013-09-17: qty 1000

## 2013-09-17 MED ORDER — LIDOCAINE HCL (PF) 1 % IJ SOLN
INTRAMUSCULAR | Status: DC | PRN
  Administered 2013-09-17: 14 mL

## 2013-09-17 NOTE — Anesthesia Procedure Notes (Signed)
Epidural Patient location during procedure: OB Start time: 09/17/2013 9:09 PM  Staffing Anesthesiologist: Manilla Strieter A. Performed by: anesthesiologist   Preanesthetic Checklist Completed: patient identified, site marked, surgical consent, pre-op evaluation, timeout performed, IV checked, risks and benefits discussed and monitors and equipment checked  Epidural Patient position: sitting Prep: site prepped and draped and DuraPrep Patient monitoring: continuous pulse ox and blood pressure Approach: midline Injection technique: LOR air  Needle:  Needle type: Tuohy  Needle gauge: 17 G Needle length: 9 cm and 9 Needle insertion depth: 5 cm cm Catheter type: closed end flexible Catheter size: 19 Gauge Catheter at skin depth: 10 cm Test dose: negative and Other  Assessment Events: blood not aspirated, injection not painful, no injection resistance, negative IV test and no paresthesia  Additional Notes Patient identified. Risks and benefits discussed including failed block, incomplete  Pain control, post dural puncture headache, nerve damage, paralysis, blood pressure Changes, nausea, vomiting, reactions to medications-both toxic and allergic and post Partum back pain. All questions were answered. Patient expressed understanding and wished to proceed. Sterile technique was used throughout procedure. Epidural site was Dressed with sterile barrier dressing. No paresthesias, signs of intravascular injection Or signs of intrathecal spread were encountered.  Patient was more comfortable after the epidural was dosed. Please see RN's note for documentation of vital signs and FHR which are stable.

## 2013-09-17 NOTE — Anesthesia Preprocedure Evaluation (Signed)

## 2013-09-17 NOTE — MAU Note (Signed)
Patient presents to MAU with c/o frequent contractions. Denies LOF nor bleeding at this time. States was seen in office earlier today and had membranes stripped; states was 4 cm in office. Reports good fetal movement.

## 2013-09-17 NOTE — H&P (Signed)
27 y.o. [redacted]w[redacted]d  [id number] comes in c/o labor.  Otherwise has good fetal movement and no bleeding.  Past Medical History  Diagnosis Date  . No pertinent past medical history   . Abortion, spontaneous 2011  . Ovarian cyst   . Medical history non-contributory     Past Surgical History  Procedure Laterality Date  . Vaginal delivery  2005. 2010  . Dilation and evacuation  08/25/2012    Procedure: DILATATION AND EVACUATION;  Surgeon: Mickel Baas, MD;  Location: [facility];  Service: Gynecology;  Laterality: N/A;    OB History  Gravida Para Term Preterm AB SAB TAB Ectopic Multiple Living  5 3 2  2 2    2     # Outcome Date GA Lbr Len/2nd Weight Sex Delivery Anes PTL Lv  5 SAB           4 SAB           3 PAR           2 TRM           1 TRM  [redacted]w[redacted]d             History   Social History  . Marital Status: Married    Spouse Name: N/A    Number of Children: N/A  . Years of Education: N/A   Occupational History  . Not on file.   Social History Main Topics  . Smoking status: Never Smoker   . Smokeless tobacco: Not on file  . Alcohol Use: No  . Drug Use: No  . Sexual Activity: Yes   Other Topics Concern  . Not on file   Social History Narrative  . No narrative on file   Penicillins    Prenatal Transfer Tool  Maternal Diabetes: No Genetic Screening: Normal Maternal Ultrasounds/Referrals: Normal Fetal Ultrasounds or other Referrals:  None Maternal Substance Abuse:  No Significant Maternal Medications:  None Significant Maternal Lab Results: None  Other XBM:[id number] Rhogam.    Filed Vitals:   09/17/13 2141  BP: 94/37  Pulse: 98  Temp:   Resp: 16     Lungs/Cor:  NAD Abdomen:  soft, gravid Ex:  no cords, erythema SVE:  5-6/50/-2, vtx FHTs:  140s, good STV, NST R Toco:  q2-3   A/P   Term labor.  GBS neg.  Semone Orlov A

## 2013-09-18 ENCOUNTER — Encounter (HOSPITAL_COMMUNITY): Payer: Self-pay | Admitting: *Deleted

## 2013-09-18 LAB — CBC
Hemoglobin: 11.7 g/dL — ABNORMAL LOW (ref 12.0–15.0)
MCV: 84.6 fL (ref 78.0–100.0)
Platelets: 216 10*3/uL (ref 150–400)
RBC: 4.09 MIL/uL (ref 3.87–5.11)
WBC: 23.7 10*3/uL — ABNORMAL HIGH (ref 4.0–10.5)

## 2013-09-18 MED ORDER — LANOLIN HYDROUS EX OINT: TOPICAL_OINTMENT | CUTANEOUS | Status: DC | PRN

## 2013-09-18 MED ORDER — SODIUM CHLORIDE 0.9 % IJ SOLN: 3.0000 mL | INTRAMUSCULAR | Status: DC | PRN

## 2013-09-18 MED ORDER — TETANUS-DIPHTH-ACELL PERTUSSIS 5-2.5-18.5 LF-MCG/0.5 IM SUSP: 0.5000 mL | Freq: Once | INTRAMUSCULAR | Status: DC

## 2013-09-18 MED ORDER — SODIUM CHLORIDE 0.9 % IJ SOLN: 3.0000 mL | Freq: Two times a day (BID) | INTRAMUSCULAR | Status: DC

## 2013-09-18 MED ORDER — DIBUCAINE 1 % RE OINT: 1.0000 "application " | TOPICAL_OINTMENT | RECTAL | Status: DC | PRN

## 2013-09-18 MED ORDER — ONDANSETRON HCL 4 MG PO TABS: 4.0000 mg | ORAL_TABLET | ORAL | Status: DC | PRN

## 2013-09-18 MED ORDER — OXYCODONE-ACETAMINOPHEN 5-325 MG PO TABS: 1.0000 | ORAL_TABLET | ORAL | Status: DC | PRN

## 2013-09-18 MED ORDER — ONDANSETRON HCL 4 MG/2ML IJ SOLN: 4.0000 mg | INTRAMUSCULAR | Status: DC | PRN

## 2013-09-18 MED ORDER — IBUPROFEN 800 MG PO TABS
800.0000 mg | ORAL_TABLET | Freq: Three times a day (TID) | ORAL | Status: DC
  Administered 2013-09-18 – 2013-09-19 (×3): 800 mg via ORAL
  Filled 2013-09-18 (×4): qty 1

## 2013-09-18 MED ORDER — PRENATAL MULTIVITAMIN CH
1.0000 | ORAL_TABLET | Freq: Every day | ORAL | Status: DC
  Administered 2013-09-18 – 2013-09-19 (×2): 1 via ORAL
  Filled 2013-09-18 (×2): qty 1

## 2013-09-18 MED ORDER — ZOLPIDEM TARTRATE 5 MG PO TABS: 5.0000 mg | ORAL_TABLET | Freq: Every evening | ORAL | Status: DC | PRN

## 2013-09-18 MED ORDER — METHYLERGONOVINE MALEATE 0.2 MG PO TABS: 0.2000 mg | ORAL_TABLET | ORAL | Status: DC | PRN

## 2013-09-18 MED ORDER — DIPHENHYDRAMINE HCL 25 MG PO CAPS: 25.0000 mg | ORAL_CAPSULE | Freq: Four times a day (QID) | ORAL | Status: DC | PRN

## 2013-09-18 MED ORDER — MAGNESIUM HYDROXIDE 400 MG/5ML PO SUSP: 30.0000 mL | ORAL | Status: DC | PRN

## 2013-09-18 MED ORDER — MEASLES, MUMPS & RUBELLA VAC ~~LOC~~ INJ
0.5000 mL | INJECTION | Freq: Once | SUBCUTANEOUS | Status: DC
  Filled 2013-09-18: qty 0.5

## 2013-09-18 MED ORDER — SODIUM CHLORIDE 0.9 % IV SOLN: 250.0000 mL | INTRAVENOUS | Status: DC | PRN

## 2013-09-18 MED ORDER — BENZOCAINE-MENTHOL 20-0.5 % EX AERO
1.0000 "application " | INHALATION_SPRAY | CUTANEOUS | Status: DC | PRN
  Filled 2013-09-18: qty 56

## 2013-09-18 MED ORDER — METHYLERGONOVINE MALEATE 0.2 MG/ML IJ SOLN
0.2000 mg | INTRAMUSCULAR | Status: DC | PRN
Start: 2013-09-18 — End: 2013-09-19

## 2013-09-18 MED ORDER — FERROUS SULFATE 325 (65 FE) MG PO TABS
325.0000 mg | ORAL_TABLET | Freq: Two times a day (BID) | ORAL | Status: DC
  Administered 2013-09-18 – 2013-09-19 (×3): 325 mg via ORAL
  Filled 2013-09-18 (×3): qty 1

## 2013-09-18 MED ORDER — SIMETHICONE 80 MG PO CHEW: 80.0000 mg | CHEWABLE_TABLET | ORAL | Status: DC | PRN

## 2013-09-18 MED ORDER — WITCH HAZEL-GLYCERIN EX PADS: 1.0000 "application " | MEDICATED_PAD | CUTANEOUS | Status: DC | PRN

## 2013-09-18 MED ORDER — SENNOSIDES-DOCUSATE SODIUM 8.6-50 MG PO TABS: 2.0000 | ORAL_TABLET | Freq: Every day | ORAL | Status: DC

## 2013-09-18 NOTE — Anesthesia Postprocedure Evaluation (Signed)
Anesthesia Post Note  Patient: Carrie Nunez  Procedure(s) Performed: * No procedures listed *  Anesthesia type: Epidural  Patient location: Mother/Baby  Post pain: Pain level controlled  Post assessment: Post-op Vital signs reviewed  Last Vitals:  Filed Vitals:   09/18/13 0830  BP: 117/73  Pulse: 80  Temp: 36.7 C  Resp: 18    Post vital signs: Reviewed  Level of consciousness:alert  Complications: No apparent anesthesia complications

## 2013-09-18 NOTE — Progress Notes (Signed)
Post Partum Day 0 Subjective: no complaints, up ad lib and voiding  Objective: Blood pressure 103/67, pulse 80, temperature 98.3 F (36.8 C), temperature source Oral, resp. rate 18, height 5\' 5"  (1.651 m), weight 77.565 kg (171 lb), SpO2 97.00%, unknown if currently breastfeeding.  Physical Exam:  General: alert, cooperative and appears stated age Lochia: appropriate Uterine Fundus: firm   Recent Labs  09/17/13 1909 09/18/13 0615  HGB 12.9 11.7*  HCT 38.0 34.6*    Assessment/Plan: Plan for discharge tomorrow   LOS: 1 day   Kerianna Rawlinson H. 09/18/2013, 5:57 PM

## 2013-09-18 NOTE — Progress Notes (Signed)
UR chart review completed.  

## 2013-09-19 MED ORDER — DOCUSATE SODIUM 100 MG PO CAPS: 100.0000 mg | ORAL_CAPSULE | Freq: Two times a day (BID) | ORAL | Status: DC

## 2013-09-19 MED ORDER — IBUPROFEN 600 MG PO TABS: 600.0000 mg | ORAL_TABLET | Freq: Four times a day (QID) | ORAL | Status: DC | PRN

## 2013-09-19 MED ORDER — OXYCODONE-ACETAMINOPHEN 5-325 MG PO TABS: 2.0000 | ORAL_TABLET | ORAL | Status: DC | PRN

## 2013-09-19 NOTE — Discharge Summary (Signed)
Obstetric Discharge Summary Reason for Admission: onset of labor Prenatal Procedures: ultrasound Intrapartum Procedures: spontaneous vaginal delivery Postpartum Procedures: none Complications-Operative and Postpartum: none Hemoglobin  Date Value Range Status  09/18/2013 11.7* 12.0 - 15.0 g/dL Final     HCT  Date Value Range Status  09/18/2013 34.6* 36.0 - 46.0 % Final    Physical Exam:  General: alert, cooperative and appears stated age Lochia: appropriate Uterine Fundus: firm  Discharge Diagnoses: Term Pregnancy-delivered  Discharge Information: Date: 09/19/2013 Activity: pelvic rest Diet: routine Medications: Ibuprofen, Colace and Percocet Condition: improved Instructions: refer to practice specific booklet Discharge to: home Follow-up Information   Follow up with HORVATH,MICHELLE A, MD In 4 weeks. (For a postpartum evaluation)    Specialty:  Obstetrics and Gynecology   Contact information:   [address] [place] [address] [phone]       Newborn Data: Live born female  Birth Weight: 7 lb 1.2 oz (3209 g) APGAR: 9, 9  Home with mother.  Milledge Gerding H. 09/19/2013, 12:36 PM

## 2013-09-19 NOTE — Lactation Note (Signed)
This note was copied from the chart of Carrie Johnisha Kanzler. Lactation Consultation Note: follow up visit with mom before DC. She reports that her nipples are very sore- bleeding. States this happened with the other 2 children. Reviewed wide open mouth and keeping the baby close to the breast throughout the feedings. Mom reports that she just fed baby 1/2 hour ago and she is asleep on mom's chest. Mom using comfort gels. No questions at present. To call prn  Patient Name: Carrie Nunez Today's Date: 09/19/2013 Reason for consult: Follow-up assessment   Maternal Data Does the patient have breastfeeding experience prior to this delivery?: Yes  Feeding   LATCH Score/Interventions          Comfort (Breast/Nipple): Filling, red/small blisters or bruises, mild/mod discomfort  Problem noted: Mild/Moderate discomfort Interventions (Mild/moderate discomfort): Comfort gels        Lactation Tools Discussed/Used     Consult Status Consult Status: Complete    Pamelia Hoit 09/19/2013, 10:39 AM

## 2013-09-19 NOTE — Progress Notes (Signed)
Post Partum Day 1  Subjective: no complaints, up ad lib and voiding  Objective: Blood pressure 97/41, pulse 67, temperature 98.2 F (36.8 C), temperature source Oral, resp. rate 18, height 5\' 5"  (1.651 m), weight 77.565 kg (171 lb), SpO2 98.00%, unknown if currently breastfeeding.  Physical Exam:  General: alert, cooperative and appears stated age Lochia: appropriate Uterine Fundus: firm   Recent Labs  09/17/13 1909 09/18/13 0615  HGB 12.9 11.7*  HCT 38.0 34.6*    Assessment/Plan: Plan for discharge tomorrow and Breastfeeding   LOS: 2 days   Suren Payne H. 09/19/2013, 12:00 PM

## 2013-09-23 ENCOUNTER — Ambulatory Visit (HOSPITAL_COMMUNITY)
Admission: RE | Admit: 2013-09-23 | Discharge: 2013-09-23 | Disposition: A | Discharge status: Home / Self Care | Payer: 59 | Source: Ambulatory Visit | Attending: Obstetrics and Gynecology | Admitting: Obstetrics and Gynecology

## 2013-09-23 NOTE — Lactation Note (Signed)
Adult Lactation Consultation Outpatient Visit Note  Patient Name: Carrie Nunez Date of Birth: 1986-03-31 Gestational Age at Delivery: Unknown Type of Delivery:   Breastfeeding History: Frequency of Breastfeeding: q 2-3 hours Length of Feeding: 20-30 min Voids: QS Stools: QS  Supplementing / Method: none Pumping:  Type of Pump: Has [employer] manual pump   Frequency: pumped yesterday but the pain was worse than baby nursing  Volume:  3 oz  Comments:    Consultation Evaluation: Mom here due to sore nipples and bleeding. MOm reports that right nipple is the worse. Both nipples with positional stripes noted- cracking in center of nipple. Mom reports they are feeling a little better today than yesterday. Using lanolin on them. Reports that comfort gels did not help very much.   Initial Feeding Assessment: Pre-feed Weight: 7- 1.5  3216g Post-feed Weight: 7- 2.9  3258g Amount Transferred: 42 cc's Comments: Observed mom latch Kenley- she latches well in cradle.hold on left breast. but tends to slide to tip of nipple. Encouraged mom to hold her breast for support throughout the feeding. When Gilbert let go- nipple pinched. Used football hold with good support and mom reports that feels better. Nipple did not look as pinched at this time. Kenley nursed for 15 minutes with lots of audible swallows. Mom reports that breast feels softer after nursing.   Additional Feeding Assessment: Pre-feed Weight: 7- 2.9  3258g Post-feed Weight: 7- 3.4  3272g Amount Transferred: 14 cc Comments: Mom complains of lots of pain with latch but does ease off after a few minutes. Used football hold on this breast since she usually uses cradle. Again encouraged her to support her breast throughout the feeding. Mom reports that it feels better than yesterday. Encouraged her to make sure Lorelee New does not slide to tip of nipple. If she does to take her off the breast and re latch her. Reviewed importance of emptying breast- to  prevent engorgement and mastitis. May want to massage her breasts and pump or hand express to get milk flowing before latching Kenley. No further questions at this time, Encouraged to call prn.   Total Breast milk Transferred this Visit: 56 cc's Total Supplement Given: 0  Additional Interventions:   Follow-Up With Ped To call here prn     Pamelia Hoit 09/23/2013, 11:15 AM

## 2014-06-07 ENCOUNTER — Other Ambulatory Visit: Payer: Self-pay | Admitting: Pediatrics

## 2014-06-07 ENCOUNTER — Ambulatory Visit: Payer: 59

## 2014-06-07 DIAGNOSIS — E301 Precocious puberty: Secondary | ICD-10-CM

## 2015-07-01 ENCOUNTER — Ambulatory Visit: Payer: Medicaid Other | Admitting: Internal Medicine

## 2016-02-17 ENCOUNTER — Other Ambulatory Visit: Payer: Self-pay | Admitting: Obstetrics and Gynecology

## 2016-02-17 DIAGNOSIS — N63 Unspecified lump in unspecified breast: Secondary | ICD-10-CM

## 2016-02-24 ENCOUNTER — Ambulatory Visit
Admission: RE | Admit: 2016-02-24 | Discharge: 2016-02-24 | Disposition: A | Discharge status: Home / Self Care | Payer: 59 | Source: Ambulatory Visit | Attending: Obstetrics and Gynecology | Admitting: Obstetrics and Gynecology

## 2016-02-24 DIAGNOSIS — N63 Unspecified lump in unspecified breast: Secondary | ICD-10-CM

## 2016-08-16 ENCOUNTER — Other Ambulatory Visit: Payer: Self-pay | Admitting: Obstetrics and Gynecology

## 2016-08-17 LAB — CYTOLOGY - PAP

## 2017-06-13 DIAGNOSIS — R5383 Other fatigue: Secondary | ICD-10-CM | POA: Diagnosis not present

## 2017-06-13 DIAGNOSIS — S70269A Insect bite (nonvenomous), unspecified hip, initial encounter: Secondary | ICD-10-CM | POA: Diagnosis not present

## 2017-06-13 DIAGNOSIS — E002 Congenital iodine-deficiency syndrome, mixed type: Secondary | ICD-10-CM | POA: Diagnosis not present

## 2017-06-13 DIAGNOSIS — Z1389 Encounter for screening for other disorder: Secondary | ICD-10-CM | POA: Diagnosis not present

## 2017-06-13 DIAGNOSIS — R002 Palpitations: Secondary | ICD-10-CM | POA: Diagnosis not present

## 2017-10-11 DIAGNOSIS — Z01419 Encounter for gynecological examination (general) (routine) without abnormal findings: Secondary | ICD-10-CM | POA: Diagnosis not present

## 2019-03-10 DIAGNOSIS — Z01419 Encounter for gynecological examination (general) (routine) without abnormal findings: Secondary | ICD-10-CM | POA: Diagnosis not present

## 2019-03-10 DIAGNOSIS — Z124 Encounter for screening for malignant neoplasm of cervix: Secondary | ICD-10-CM | POA: Diagnosis not present

## 2019-03-10 DIAGNOSIS — Z30432 Encounter for removal of intrauterine contraceptive device: Secondary | ICD-10-CM | POA: Diagnosis not present

## 2019-03-17 DIAGNOSIS — M542 Cervicalgia: Secondary | ICD-10-CM | POA: Diagnosis not present

## 2019-03-17 DIAGNOSIS — R490 Dysphonia: Secondary | ICD-10-CM | POA: Diagnosis not present

## 2019-03-17 DIAGNOSIS — K219 Gastro-esophageal reflux disease without esophagitis: Secondary | ICD-10-CM | POA: Diagnosis not present

## 2020-01-01 NOTE — L&D Delivery Note (Signed)
Delivery Note At 10:08 PM a viable and healthy female was delivered via Vaginal, Spontaneous (Presentation: Right Occiput Anterior).  APGAR: 8, 9; weight pending .   Placenta status: Spontaneous, Intact.  Cord: 3 vessels   Anesthesia: Epidural Episiotomy: None Lacerations: None Suture Repair: NA Est. Blood Loss (mL): 86  Mom to postpartum.  Baby to Couplet care / Skin to Skin.  Waynard Reeds 03/25/2020, 10:28 PM

## 2020-03-23 ENCOUNTER — Other Ambulatory Visit: Payer: Self-pay | Admitting: Obstetrics and Gynecology

## 2020-03-24 ENCOUNTER — Telehealth (HOSPITAL_COMMUNITY): Payer: Self-pay | Admitting: *Deleted

## 2020-03-24 ENCOUNTER — Encounter (HOSPITAL_COMMUNITY): Payer: Self-pay | Admitting: *Deleted

## 2020-03-24 NOTE — Telephone Encounter (Signed)
Preadmission screen  

## 2020-03-25 ENCOUNTER — Inpatient Hospital Stay (HOSPITAL_COMMUNITY): Payer: Commercial Managed Care - PPO | Admitting: Anesthesiology

## 2020-03-25 ENCOUNTER — Inpatient Hospital Stay (HOSPITAL_COMMUNITY)
Admission: AD | Admit: 2020-03-25 | Discharge: 2020-03-27 | DRG: 807 | Disposition: A | Discharge status: Home / Self Care | Payer: Commercial Managed Care - PPO | Attending: Obstetrics and Gynecology | Admitting: Obstetrics and Gynecology

## 2020-03-25 ENCOUNTER — Other Ambulatory Visit: Payer: Self-pay

## 2020-03-25 ENCOUNTER — Encounter (HOSPITAL_COMMUNITY): Payer: Self-pay | Admitting: Obstetrics and Gynecology

## 2020-03-25 DIAGNOSIS — O99824 Streptococcus B carrier state complicating childbirth: Secondary | ICD-10-CM | POA: Diagnosis present

## 2020-03-25 DIAGNOSIS — Z880 Allergy status to penicillin: Secondary | ICD-10-CM

## 2020-03-25 DIAGNOSIS — Z3A40 40 weeks gestation of pregnancy: Secondary | ICD-10-CM | POA: Diagnosis not present

## 2020-03-25 DIAGNOSIS — O480 Post-term pregnancy: Principal | ICD-10-CM | POA: Diagnosis present

## 2020-03-25 DIAGNOSIS — Z20822 Contact with and (suspected) exposure to covid-19: Secondary | ICD-10-CM | POA: Diagnosis present

## 2020-03-25 LAB — CBC
HCT: 42.8 % (ref 36.0–46.0)
Hemoglobin: 13.8 g/dL (ref 12.0–15.0)
MCH: 28.6 pg (ref 26.0–34.0)
MCHC: 32.2 g/dL (ref 30.0–36.0)
MCV: 88.8 fL (ref 80.0–100.0)
Platelets: 249 10*3/uL (ref 150–400)
RBC: 4.82 MIL/uL (ref 3.87–5.11)
RDW: 14 % (ref 11.5–15.5)
WBC: 23.7 10*3/uL — ABNORMAL HIGH (ref 4.0–10.5)
nRBC: 0 % (ref 0.0–0.2)

## 2020-03-25 LAB — RESPIRATORY PANEL BY RT PCR (FLU A&B, COVID)
Influenza A by PCR: NEGATIVE
Influenza B by PCR: NEGATIVE
SARS Coronavirus 2 by RT PCR: NEGATIVE

## 2020-03-25 LAB — TYPE AND SCREEN
ABO/RH(D): A NEG
Antibody Screen: POSITIVE

## 2020-03-25 LAB — POCT FERN TEST: POCT Fern Test: NEGATIVE

## 2020-03-25 MED ORDER — FENTANYL CITRATE (PF) 100 MCG/2ML IJ SOLN
INTRAMUSCULAR | Status: AC
  Administered 2020-03-25: 100 ug via EPIDURAL
  Filled 2020-03-25: qty 2

## 2020-03-25 MED ORDER — OXYTOCIN 40 UNITS IN NORMAL SALINE INFUSION - SIMPLE MED
2.5000 [IU]/h | INTRAVENOUS | Status: DC
  Administered 2020-03-25: 2.5 [IU]/h via INTRAVENOUS

## 2020-03-25 MED ORDER — LACTATED RINGERS IV SOLN: 500.0000 mL | INTRAVENOUS | Status: DC | PRN

## 2020-03-25 MED ORDER — LACTATED RINGERS IV SOLN: INTRAVENOUS | Status: DC

## 2020-03-25 MED ORDER — PHENYLEPHRINE 40 MCG/ML (10ML) SYRINGE FOR IV PUSH (FOR BLOOD PRESSURE SUPPORT)
80.0000 ug | PREFILLED_SYRINGE | INTRAVENOUS | Status: DC | PRN
  Administered 2020-03-25 (×2): 80 ug via INTRAVENOUS

## 2020-03-25 MED ORDER — OXYCODONE-ACETAMINOPHEN 5-325 MG PO TABS: 2.0000 | ORAL_TABLET | ORAL | Status: DC | PRN

## 2020-03-25 MED ORDER — TERBUTALINE SULFATE 1 MG/ML IJ SOLN: 0.2500 mg | Freq: Once | INTRAMUSCULAR | Status: DC | PRN

## 2020-03-25 MED ORDER — SODIUM CHLORIDE (PF) 0.9 % IJ SOLN
INTRAMUSCULAR | Status: DC | PRN
  Administered 2020-03-25: 12 mL/h via EPIDURAL

## 2020-03-25 MED ORDER — BUPIVACAINE HCL (PF) 0.25 % IJ SOLN
INTRAMUSCULAR | Status: DC | PRN
  Administered 2020-03-25 (×2): 3 mL via EPIDURAL

## 2020-03-25 MED ORDER — LIDOCAINE-EPINEPHRINE (PF) 2 %-1:200000 IJ SOLN
INTRAMUSCULAR | Status: DC | PRN
  Administered 2020-03-25: 3 mL via EPIDURAL
  Administered 2020-03-25: 4 mL via EPIDURAL

## 2020-03-25 MED ORDER — FENTANYL CITRATE (PF) 100 MCG/2ML IJ SOLN
INTRAMUSCULAR | Status: DC | PRN
  Administered 2020-03-25: 100 ug via INTRAVENOUS

## 2020-03-25 MED ORDER — EPHEDRINE 5 MG/ML INJ: 10.0000 mg | INTRAVENOUS | Status: DC | PRN

## 2020-03-25 MED ORDER — ACETAMINOPHEN 325 MG PO TABS: 650.0000 mg | ORAL_TABLET | ORAL | Status: DC | PRN

## 2020-03-25 MED ORDER — LACTATED RINGERS IV SOLN
500.0000 mL | Freq: Once | INTRAVENOUS | Status: AC
  Administered 2020-03-25: 1000 mL via INTRAVENOUS

## 2020-03-25 MED ORDER — CEFAZOLIN SODIUM-DEXTROSE 2-4 GM/100ML-% IV SOLN
2.0000 g | Freq: Three times a day (TID) | INTRAVENOUS | Status: DC
  Administered 2020-03-25: 2 g via INTRAVENOUS
  Filled 2020-03-25 (×2): qty 100

## 2020-03-25 MED ORDER — OXYTOCIN BOLUS FROM INFUSION
500.0000 mL | Freq: Once | INTRAVENOUS | Status: AC
  Administered 2020-03-25: 500 mL via INTRAVENOUS

## 2020-03-25 MED ORDER — LIDOCAINE HCL (PF) 1 % IJ SOLN: 30.0000 mL | INTRAMUSCULAR | Status: DC | PRN

## 2020-03-25 MED ORDER — FLEET ENEMA 7-19 GM/118ML RE ENEM: 1.0000 | ENEMA | RECTAL | Status: DC | PRN

## 2020-03-25 MED ORDER — FENTANYL-BUPIVACAINE-NACL 0.5-0.125-0.9 MG/250ML-% EP SOLN
12.0000 mL/h | EPIDURAL | Status: DC | PRN
  Filled 2020-03-25: qty 250

## 2020-03-25 MED ORDER — OXYCODONE-ACETAMINOPHEN 5-325 MG PO TABS: 1.0000 | ORAL_TABLET | ORAL | Status: DC | PRN

## 2020-03-25 MED ORDER — OXYTOCIN 40 UNITS IN NORMAL SALINE INFUSION - SIMPLE MED
1.0000 m[IU]/min | INTRAVENOUS | Status: DC
  Administered 2020-03-25: 2 m[IU]/min via INTRAVENOUS
  Filled 2020-03-25: qty 1000

## 2020-03-25 MED ORDER — SOD CITRATE-CITRIC ACID 500-334 MG/5ML PO SOLN: 30.0000 mL | ORAL | Status: DC | PRN

## 2020-03-25 MED ORDER — DIPHENHYDRAMINE HCL 50 MG/ML IJ SOLN: 12.5000 mg | INTRAMUSCULAR | Status: DC | PRN

## 2020-03-25 MED ORDER — PHENYLEPHRINE 40 MCG/ML (10ML) SYRINGE FOR IV PUSH (FOR BLOOD PRESSURE SUPPORT)
80.0000 ug | PREFILLED_SYRINGE | INTRAVENOUS | Status: DC | PRN
  Filled 2020-03-25: qty 10

## 2020-03-25 MED ORDER — ONDANSETRON HCL 4 MG/2ML IJ SOLN
4.0000 mg | Freq: Four times a day (QID) | INTRAMUSCULAR | Status: DC | PRN
  Administered 2020-03-25: 4 mg via INTRAVENOUS
  Filled 2020-03-25: qty 2

## 2020-03-25 NOTE — Anesthesia Preprocedure Evaluation (Signed)
Anesthesia Evaluation  Patient identified by MRN, date of birth, ID band Patient awake    Reviewed: Allergy & Precautions, Patient's Chart, lab work & pertinent test results  History of Anesthesia Complications Negative for: history of anesthetic complications  Airway Mallampati: II  TM Distance: >3 FB Neck ROM: Full    Dental  (+) Dental Advisory Given   Pulmonary neg pulmonary ROS, neg recent URI,    breath sounds clear to auscultation       Cardiovascular negative cardio ROS   Rhythm:Regular     Neuro/Psych negative neurological ROS  negative psych ROS   GI/Hepatic negative GI ROS, Neg liver ROS,   Endo/Other  negative endocrine ROS  Renal/GU negative Renal ROS     Musculoskeletal   Abdominal   Peds  Hematology negative hematology ROS (+) plt 249   Anesthesia Other Findings   Reproductive/Obstetrics (+) Pregnancy                             Anesthesia Physical Anesthesia Plan  ASA: II  Anesthesia Plan: Epidural   Post-op Pain Management:    Induction:   PONV Risk Score and Plan: 2 and Treatment may vary due to age or medical condition  Airway Management Planned:   Additional Equipment: Fetal Monitoring  Intra-op Plan:   Post-operative Plan:   Informed Consent: I have reviewed the patients History and Physical, chart, labs and discussed the procedure including the risks, benefits and alternatives for the proposed anesthesia with the patient or authorized representative who has indicated his/her understanding and acceptance.       Plan Discussed with:   Anesthesia Plan Comments:         Anesthesia Quick Evaluation

## 2020-03-25 NOTE — MAU Note (Signed)
Presents with c/o ctxs every 1-3 minutes apart  since 1300.   Also reports LOF since yesterday.  Denies VB.  Endorses +FM, but less than usual.

## 2020-03-25 NOTE — Anesthesia Procedure Notes (Signed)
Epidural Patient location during procedure: floor Start time: 03/25/2020 6:48 PM End time: 03/25/2020 7:07 PM  Staffing Anesthesiologist: Val Eagle, MD Performed: anesthesiologist   Preanesthetic Checklist Completed: patient identified, IV checked, risks and benefits discussed, surgical consent, monitors and equipment checked, pre-op evaluation and timeout performed  Epidural Patient position: sitting Prep: DuraPrep Patient monitoring: heart rate, continuous pulse ox and blood pressure Approach: midline Location: L3-L4 Injection technique: LOR saline  Needle:  Needle type: Tuohy  Needle gauge: 17 G Needle length: 9 cm Needle insertion depth: 7 cm Catheter type: closed end flexible Catheter size: 19 Gauge Catheter at skin depth: 12 cm Test dose: negative and 2% lidocaine with Epi 1:200 K  Additional Notes Reason for block:at surgeon's request and procedure for pain

## 2020-03-26 ENCOUNTER — Encounter (HOSPITAL_COMMUNITY): Payer: Self-pay | Admitting: Obstetrics and Gynecology

## 2020-03-26 LAB — CBC
HCT: 39.2 % (ref 36.0–46.0)
Hemoglobin: 12.9 g/dL (ref 12.0–15.0)
MCH: 29 pg (ref 26.0–34.0)
MCHC: 32.9 g/dL (ref 30.0–36.0)
MCV: 88.1 fL (ref 80.0–100.0)
Platelets: 234 10*3/uL (ref 150–400)
RBC: 4.45 MIL/uL (ref 3.87–5.11)
RDW: 14 % (ref 11.5–15.5)
WBC: 28.5 10*3/uL — ABNORMAL HIGH (ref 4.0–10.5)
nRBC: 0 % (ref 0.0–0.2)

## 2020-03-26 LAB — RPR: RPR Ser Ql: NONREACTIVE

## 2020-03-26 MED ORDER — METHYLERGONOVINE MALEATE 0.2 MG/ML IJ SOLN: 0.2000 mg | INTRAMUSCULAR | Status: DC | PRN

## 2020-03-26 MED ORDER — DIBUCAINE (PERIANAL) 1 % EX OINT: 1.0000 "application " | TOPICAL_OINTMENT | CUTANEOUS | Status: DC | PRN

## 2020-03-26 MED ORDER — DIPHENHYDRAMINE HCL 25 MG PO CAPS: 25.0000 mg | ORAL_CAPSULE | Freq: Four times a day (QID) | ORAL | Status: DC | PRN

## 2020-03-26 MED ORDER — WITCH HAZEL-GLYCERIN EX PADS: 1.0000 "application " | MEDICATED_PAD | CUTANEOUS | Status: DC | PRN

## 2020-03-26 MED ORDER — TETANUS-DIPHTH-ACELL PERTUSSIS 5-2.5-18.5 LF-MCG/0.5 IM SUSP: 0.5000 mL | Freq: Once | INTRAMUSCULAR | Status: DC

## 2020-03-26 MED ORDER — COCONUT OIL OIL
1.0000 "application " | TOPICAL_OIL | Status: DC | PRN
  Administered 2020-03-26: 1 via TOPICAL

## 2020-03-26 MED ORDER — BENZOCAINE-MENTHOL 20-0.5 % EX AERO
1.0000 "application " | INHALATION_SPRAY | CUTANEOUS | Status: DC | PRN
  Administered 2020-03-26: 1 via TOPICAL
  Filled 2020-03-26: qty 56

## 2020-03-26 MED ORDER — RHO D IMMUNE GLOBULIN 1500 UNIT/2ML IJ SOSY
300.0000 ug | PREFILLED_SYRINGE | Freq: Once | INTRAMUSCULAR | Status: AC
  Administered 2020-03-26: 300 ug via INTRAVENOUS
  Filled 2020-03-26: qty 2

## 2020-03-26 MED ORDER — SIMETHICONE 80 MG PO CHEW: 80.0000 mg | CHEWABLE_TABLET | ORAL | Status: DC | PRN

## 2020-03-26 MED ORDER — PRENATAL MULTIVITAMIN CH
1.0000 | ORAL_TABLET | Freq: Every day | ORAL | Status: DC
  Administered 2020-03-26 – 2020-03-27 (×2): 1 via ORAL
  Filled 2020-03-26 (×2): qty 1

## 2020-03-26 MED ORDER — ONDANSETRON HCL 4 MG PO TABS: 4.0000 mg | ORAL_TABLET | ORAL | Status: DC | PRN

## 2020-03-26 MED ORDER — OXYCODONE HCL 5 MG PO TABS: 5.0000 mg | ORAL_TABLET | ORAL | Status: DC | PRN

## 2020-03-26 MED ORDER — ACETAMINOPHEN 325 MG PO TABS: 650.0000 mg | ORAL_TABLET | ORAL | Status: DC | PRN

## 2020-03-26 MED ORDER — SENNOSIDES-DOCUSATE SODIUM 8.6-50 MG PO TABS
2.0000 | ORAL_TABLET | ORAL | Status: DC
  Administered 2020-03-26 (×2): 2 via ORAL
  Filled 2020-03-26 (×2): qty 2

## 2020-03-26 MED ORDER — ZOLPIDEM TARTRATE 5 MG PO TABS: 5.0000 mg | ORAL_TABLET | Freq: Every evening | ORAL | Status: DC | PRN

## 2020-03-26 MED ORDER — ONDANSETRON HCL 4 MG/2ML IJ SOLN: 4.0000 mg | INTRAMUSCULAR | Status: DC | PRN

## 2020-03-26 MED ORDER — OXYCODONE HCL 5 MG PO TABS: 10.0000 mg | ORAL_TABLET | ORAL | Status: DC | PRN

## 2020-03-26 MED ORDER — METHYLERGONOVINE MALEATE 0.2 MG PO TABS: 0.2000 mg | ORAL_TABLET | ORAL | Status: DC | PRN

## 2020-03-26 MED ORDER — IBUPROFEN 600 MG PO TABS
600.0000 mg | ORAL_TABLET | Freq: Four times a day (QID) | ORAL | Status: DC
  Administered 2020-03-26 – 2020-03-27 (×7): 600 mg via ORAL
  Filled 2020-03-26 (×7): qty 1

## 2020-03-26 NOTE — Lactation Note (Signed)
This note was copied from a baby's chart. Lactation Consultation Note  Patient Name: Carrie Nunez Today's Date: 03/26/2020 Reason for consult: Initial assessment;Term;Other (Comment)(LGA infant greater than 9 lbs at birth) P4, 5 hour LGA term infant greater than 9 lbs at birth. Infant had one stool since birth. Per mom, she has DEBP at home. Per day this will be infant's 2nd time latching at breast. Per mom, it takes a few days for her milk to come in and after day 3 her milk supply is good.  Mom is experienced at breastfeeding, mom breastfed 1st child for one year, 2nd child for 2 years and 3rd child who is 6 years for almost 3 years. Per mom, infant breastfed for few minutes in L&D, when LC entered the room infant was cuing to breastfed. Mom undressed infant, latched infant on right breast using the football hold position, Mom brought infant chin first, nose and chin touching breast, swallows were heard and infant was still breastfeeding after 12 minutes when LC left room. Mom knows to breastfeed infant according to hunger cues, 8 to 12 times within 24 hours and not exceed 3 hours without breastfeeding infant. Mom knows to ask RN or LC for assistance if she has questions, concerns or needs help with latching infant at breast. Parents will continue to do as much STS with infant as possible. Reviewed Baby & Me book's Breastfeeding Basics.  LC discussed breastfeeding resources after hospital discharge: [facility] hotline, [facility] outpatient clinic and [facility] online breastfeeding supporting group.       Maternal Data Formula Feeding for Exclusion: No Has patient been taught Hand Expression?: Yes Does the patient have breastfeeding experience prior to this delivery?: Yes  Feeding Feeding Type: Breast Fed  LATCH Score Latch: Grasps breast easily, tongue down, lips flanged, rhythmical sucking.  Audible Swallowing: Spontaneous and intermittent  Type of Nipple: Everted at rest and after  stimulation  Comfort (Breast/Nipple): Soft / non-tender  Hold (Positioning): Assistance needed to correctly position infant at breast and maintain latch.  LATCH Score: 9  Interventions Interventions: Breast feeding basics reviewed;Breast compression;Assisted with latch;Adjust position;Skin to skin;Support pillows;Breast massage;Position options;Hand express;Expressed milk  Lactation Tools Discussed/Used WIC Program: No   Consult Status Consult Status: Follow-up Date: 03/26/20 Follow-up type: In-patient    Danelle Earthly 03/26/2020, 3:15 AM

## 2020-03-26 NOTE — Lactation Note (Addendum)
This note was copied from a baby's chart. Lactation Consultation Note  Patient Name: Carrie Nunez Today's Date: 03/26/2020 Reason for consult: Follow-up assessment;Term(LGA greater than 9 lbs) P4, 24 hour female infant LGA greater 9 lbs with weight loss -2%, DAT+ but low risk area 4.8% at 1900 hrs.. Infant had 6 stools and 8 voids. Per mom, she feels infant may be her best breastfeeder, breastfeeding is going well. Mom doesn't want use DEBP at this time, prefers to latch infant to breast only.  Per mom, she is having some breast soreness, LC observed latch and suggested mom to use cross cradle hold position instead of cradle to support breast and help infant have a deeper latch. Mom open to suggestion, Mom latched infant on left breast using the cross cradle hold, infant nose and chin was touching breast, swallows observed and infant had deep latch, per mom no soreness or pain with latch. Mom was still breastfeeding when [facility] left room after 6 minutes.  Mom knows to call RN or LC if she has any questions, concerns or need assistance with latching infant at breast.   Maternal Data    Feeding Feeding Type: Breast Fed  LATCH Score Latch: Grasps breast easily, tongue down, lips flanged, rhythmical sucking.  Audible Swallowing: Spontaneous and intermittent  Type of Nipple: Everted at rest and after stimulation  Comfort (Breast/Nipple): Soft / non-tender  Hold (Positioning): Assistance needed to correctly position infant at breast and maintain latch.  LATCH Score: 9  Interventions Interventions: Support pillows;Position options;Adjust position  Lactation Tools Discussed/Used     Consult Status Consult Status: Follow-up Date: 03/27/20 Follow-up type: In-patient    Danelle Earthly 03/26/2020, 11:02 PM

## 2020-03-26 NOTE — Progress Notes (Signed)
Post Partum Day 1 Subjective: no complaints, up ad lib, voiding and tolerating PO  Objective: Blood pressure 101/63, pulse 81, temperature 98.2 F (36.8 C), temperature source Oral, resp. rate 18, height 5\' 5"  (1.651 m), weight 83.7 kg, SpO2 99 %, unknown if currently breastfeeding.  Physical Exam:  General: alert, cooperative and appears stated age Lochia: appropriate Uterine Fundus: firm DVT Evaluation: No evidence of DVT seen on physical exam.  Recent Labs    03/25/20 1800 03/26/20 0451  HGB 13.8 12.9  HCT 42.8 39.2    Assessment/Plan: Plan for discharge tomorrow and Breastfeeding  Desires neonatal circumcision, R/B/A of procedure discussed at length. Pt understands that neonatal circumcision is not considered medically necessary and is elective. The risks include, but are not limited to bleeding, infection, damage to the penis, development of scar tissue, and having to have it redone at a later date. Pt understands theses risks and wishes to proceed    LOS: 1 day   03/28/20 03/26/2020, 11:18 AM

## 2020-03-26 NOTE — Anesthesia Postprocedure Evaluation (Signed)
Anesthesia Post Note  Patient: Carrie Nunez  Procedure(s) Performed: AN AD HOC LABOR EPIDURAL     Patient location during evaluation: Mother Baby Anesthesia Type: Epidural Level of consciousness: awake and alert Pain management: pain level controlled Vital Signs Assessment: post-procedure vital signs reviewed and stable Respiratory status: spontaneous breathing, nonlabored ventilation and respiratory function stable Cardiovascular status: stable Postop Assessment: no headache, no backache and epidural receding Anesthetic complications: no    Last Vitals:  Vitals:   03/26/20 0134 03/26/20 0558  BP: 112/65 106/66  Pulse: 85 79  Resp: 18 18  Temp: 36.9 C 36.9 C  SpO2:      Last Pain:  Vitals:   03/26/20 0558  TempSrc: Oral  PainSc:    Pain Goal: Patients Stated Pain Goal: 10 (03/25/20 1826)                 Rica Records

## 2020-03-26 NOTE — Plan of Care (Signed)
  Problem: Education: Goal: Knowledge of condition will improve Outcome: Completed/Met   Problem: Activity: Goal: Will verbalize the importance of balancing activity with adequate rest periods Outcome: Completed/Met Goal: Ability to tolerate increased activity will improve Outcome: Completed/Met   Problem: Life Cycle: Goal: Chance of risk for complications during the postpartum period will decrease Outcome: Completed/Met   Problem: Role Relationship: Goal: Ability to demonstrate positive interaction with newborn will improve Outcome: Completed/Met   Problem: Skin Integrity: Goal: Demonstration of wound healing without infection will improve Outcome: Completed/Met   

## 2020-03-27 ENCOUNTER — Encounter (HOSPITAL_COMMUNITY): Payer: Self-pay | Admitting: Obstetrics and Gynecology

## 2020-03-27 LAB — RH IG WORKUP (INCLUDES ABO/RH)
ABO/RH(D): A NEG
Fetal Screen: NEGATIVE
Gestational Age(Wks): 40.2
Unit division: 0

## 2020-03-27 MED ORDER — IBUPROFEN 600 MG PO TABS: 600.0000 mg | ORAL_TABLET | Freq: Four times a day (QID) | ORAL | 0 refills | Status: DC | PRN

## 2020-03-27 NOTE — Lactation Note (Signed)
This note was copied from a baby's chart. Lactation Consultation Note  Patient Name: Carrie Nunez Today's Date: 03/27/2020 Reason for consult: Follow-up assessment   P4, Baby 35 hours old and baby is sucking pacifier.  7.5% weight loss. 7 voids and 9 stools in the last 24 hours.  Discussed waiting on pacifier use until baby is 38 weeks old.  Pacifier use not recommended at this time. Parents continue to use pacifier by choice.  Parents state he wants to bf often.  Discussed cluster feeding. Recommend feeding on demand. Feed on demand with cues.  Goal 8-12+ times per day after first 24 hrs.  Place baby STS if not cueing.  Encouraged mother to hand express and feed on both breasts. Reviewed engorgement care and monitoring voids/stools.      Maternal Data    Feeding    LATCH Score                   Interventions Interventions: Breast feeding basics reviewed  Lactation Tools Discussed/Used     Consult Status Consult Status: Follow-up Date: 03/28/20 Follow-up type: In-patient    [name] [facility] 03/27/2020, 10:00 AM

## 2020-03-27 NOTE — Discharge Summary (Signed)
Obstetric Discharge Summary Reason for Admission: onset of labor Prenatal Procedures: NST and Ultrasound Intrapartum Procedures: spontaneous vaginal delivery Postpartum Procedures: none Complications-Operative and Postpartum: none Hemoglobin  Date Value Ref Range Status  03/26/2020 12.9 12.0 - 15.0 g/dL Final   HCT  Date Value Ref Range Status  03/26/2020 39.2 36.0 - 46.0 % Final    Physical Exam:  General: alert, cooperative and appears stated age 34: appropriate Uterine Fundus: firm DVT Evaluation: No evidence of DVT seen on physical exam.  Discharge Diagnoses: Term Pregnancy-delivered  Discharge Information: Date: 03/27/2020 Activity: pelvic rest Diet: routine Medications: Ibuprofen Condition: improved Instructions: refer to practice specific booklet Discharge to: home Follow-up Information    Waynard Reeds, MD Follow up in 4 week(s).   Specialty: Obstetrics and Gynecology Why: For a postpartum evaluation Contact information: [address] [place] [address] [phone]           Newborn Data: Live born female  Birth Weight: 9 lb 5.6 oz (4241 g) APGAR: 8, 9  Newborn Delivery   Birth date/time: 03/25/2020 22:08:00 Delivery type: Vaginal, Spontaneous      Home with mother.  Waynard Reeds 03/27/2020, 11:05 AM

## 2020-03-28 ENCOUNTER — Other Ambulatory Visit (HOSPITAL_COMMUNITY): Payer: Commercial Managed Care - PPO

## 2020-03-29 ENCOUNTER — Inpatient Hospital Stay (HOSPITAL_COMMUNITY): Payer: Commercial Managed Care - PPO

## 2020-03-29 ENCOUNTER — Inpatient Hospital Stay (HOSPITAL_COMMUNITY)
Admission: AD | Admit: 2020-03-29 | Payer: Commercial Managed Care - PPO | Source: Home / Self Care | Admitting: Obstetrics and Gynecology

## 2020-04-20 NOTE — H&P (Signed)
Carrie Nunez is a 34 y.o. female presenting for PD IOL  34 yo [id number] @ 40+2 presents for IOL OB History    Gravida  6   Para  4   Term  4   Preterm  0   AB  2   Living  4     SAB  2   TAB  0   Ectopic  0   Multiple  0   Live Births  2          Past Medical History:  Diagnosis Date  . Abortion, spontaneous 2011  . Medical history non-contributory   . No pertinent past medical history   . Ovarian cyst    Past Surgical History:  Procedure Laterality Date  . BREAST BIOPSY     patient states a lump is tagged in breast  . DILATION AND EVACUATION  08/25/2012   Procedure: DILATATION AND EVACUATION;  Surgeon: Mickel Baas, MD;  Location: [facility];  Service: Gynecology;  Laterality: N/A;  . VAGINAL DELIVERY  2005. 2010   Family History: family history is not on file. Social History:  reports that she has never smoked. She has never used smokeless tobacco. She reports that she does not drink alcohol or use drugs.     Maternal Diabetes: No Genetic Screening: Normal Maternal Ultrasounds/Referrals: Normal Fetal Ultrasounds or other Referrals:  None Maternal Substance Abuse:  No Significant Maternal Medications:  None Significant Maternal Lab Results:  Group B Strep positive Other Comments:  None  Review of Systems History Dilation: 10 Effacement (%): 100 Station: Plus 1 Exam by:: Mattel, RN  Blood pressure 100/83, pulse 79, temperature 98.3 F (36.8 C), temperature source Oral, resp. rate 18, height 5\' 5"  (1.651 m), weight 83.7 kg, SpO2 98 %, unknown if currently breastfeeding. Exam Physical Exam  Prenatal labs: ABO, Rh: --/--/A NEG (03/27 0451) Antibody: POS (03/26 1801) Rubella:  imm RPR: NON REACTIVE (03/26 1800)  HBsAg:   neg HIV:   nr GBS:   pos  Assessment/Plan: 1) Admit 2) PCN for GBS 3) Pitocin augmentation, AROM when able 4) Epidural on request   03-14-2000 04/20/2020, 3:24 PM

## 2021-12-31 NOTE — L&D Delivery Note (Signed)
DELIVERY NOTE  Pt complete and at +2 station with urge to push. Epidural controlling pain. Pt pushed and delivered a viable fmnale infant in OP position. Anterior and posterior shoulders spontaneously delivered with next two pushes; body easily followed next. Infant placed on mothers abdomen and bulb suction of mouth and nose performed. Cord was then clamped and cut by FOB. Cord blood obtained, 3VC. Baby had a vigorous spontaneous cry noted. Placenta then delivered at 0927 intact. Fundal massage performed and pitocin per protocol. Fundus firm. The following lacerations were noted: NONE, EBL 202. Mother and baby stable. Counts correct   Infant time: 0921 Gender: female Placenta time: 0927 Apgars: 9/9 Weight: pending skin-to-skin

## 2022-03-20 LAB — OB RESULTS CONSOLE ABO/RH: RH Type: NEGATIVE

## 2022-03-20 LAB — HEPATITIS C ANTIBODY: HCV Ab: NEGATIVE

## 2022-03-20 LAB — OB RESULTS CONSOLE RUBELLA ANTIBODY, IGM: Rubella: IMMUNE

## 2022-03-20 LAB — OB RESULTS CONSOLE GC/CHLAMYDIA
Chlamydia: NEGATIVE
Neisseria Gonorrhea: NEGATIVE

## 2022-03-20 LAB — OB RESULTS CONSOLE RPR: RPR: NONREACTIVE

## 2022-03-20 LAB — OB RESULTS CONSOLE HIV ANTIBODY (ROUTINE TESTING): HIV: NONREACTIVE

## 2022-03-20 LAB — OB RESULTS CONSOLE HEPATITIS B SURFACE ANTIGEN: Hepatitis B Surface Ag: NEGATIVE

## 2022-07-26 LAB — OB RESULTS CONSOLE ANTIBODY SCREEN: Antibody Screen: POSITIVE

## 2022-09-19 LAB — OB RESULTS CONSOLE GBS: GBS: NEGATIVE

## 2022-10-08 ENCOUNTER — Telehealth (HOSPITAL_COMMUNITY): Payer: Self-pay | Admitting: *Deleted

## 2022-10-08 ENCOUNTER — Encounter (HOSPITAL_COMMUNITY): Payer: Self-pay | Admitting: *Deleted

## 2022-10-08 NOTE — Telephone Encounter (Signed)
Preadmission screen  

## 2022-10-11 ENCOUNTER — Encounter (HOSPITAL_COMMUNITY): Payer: Self-pay | Admitting: Obstetrics and Gynecology

## 2022-10-11 ENCOUNTER — Inpatient Hospital Stay (EMERGENCY_DEPARTMENT_HOSPITAL)
Admission: AD | Admit: 2022-10-11 | Discharge: 2022-10-11 | Disposition: A | Discharge status: Home / Self Care | Payer: 59 | Source: Home / Self Care | Attending: Obstetrics and Gynecology | Admitting: Obstetrics and Gynecology

## 2022-10-11 ENCOUNTER — Inpatient Hospital Stay (HOSPITAL_COMMUNITY): Payer: 59 | Attending: Physician Assistant

## 2022-10-11 ENCOUNTER — Inpatient Hospital Stay (HOSPITAL_COMMUNITY): Admission: AD | Admit: 2022-10-11 | Payer: 59 | Source: Home / Self Care | Admitting: Obstetrics and Gynecology

## 2022-10-11 DIAGNOSIS — O471 False labor at or after 37 completed weeks of gestation: Secondary | ICD-10-CM | POA: Insufficient documentation

## 2022-10-11 DIAGNOSIS — Z3A39 39 weeks gestation of pregnancy: Secondary | ICD-10-CM

## 2022-10-11 DIAGNOSIS — Z3689 Encounter for other specified antenatal screening: Secondary | ICD-10-CM

## 2022-10-11 DIAGNOSIS — Z3493 Encounter for supervision of normal pregnancy, unspecified, third trimester: Secondary | ICD-10-CM

## 2022-10-11 LAB — POCT FERN TEST: POCT Fern Test: NEGATIVE

## 2022-10-11 NOTE — MAU Provider Note (Signed)
Event Date/Time  First Provider Initiated Contact with Patient 10/11/22 2112     S: Ms. Carrie Nunez is a 36 y.o. [id number] at [redacted]w[redacted]d  who presents to MAU today complaining of leaking of fluid since 0330 today. Her leaking stopped around noon. Patient states that with a previous pregnancy she SROM'd but her fern slide was never positive. She denies vaginal bleeding. She endorses mild contractions. She reports normal fetal movement.    Patient was this morning's scheduled IOL for AMA. She states she was never called and does not know what to do. She "thought they would call by 0830 to bring me in".   O: BP 114/67 (BP Location: Right Arm)   Pulse 97   Temp 97.9 F (36.6 C) (Oral)   Resp 17   Ht 5\' 5"  (1.651 m)   Wt 79.8 kg   SpO2 99%   BMI 29.29 kg/m  GENERAL: Well-developed, well-nourished female in no acute distress.  HEAD: Normocephalic, atraumatic.  CHEST: Normal effort of breathing, regular heart rate ABDOMEN: Soft, nontender, gravid PELVIC: Normal external female genitalia. Vagina is pink and rugated. Cervix with normal contour, no lesions. Normal discharge.  Negative pooling.   Cervical exam:  Dilation: 2 Effacement (%): 50 Cervical Position: Posterior Station: -3 Exam by:: Elray Mcgregor, RN   Fetal Monitoring: Baseline: 135 Variability: Mod Accelerations: 15 x 15 Decelerations: N/A Contractions: q 3-7 min  Results for orders placed or performed during the hospital encounter of 10/11/22 (from the past 24 hour(s))  POCT fern test     Status: None   Collection Time: 10/11/22  9:41 PM  Result Value Ref Range   POCT Fern Test Negative = intact amniotic membranes      A: [facility] at [redacted]w[redacted]d  Intact membranes (negative pooling, negative gush with Valsalva, negative fern) Cat I tracing Cervix 2cm/thick/posterior (unchanged from office) Patient declines Amnisure Patient declines recheck of cervix in one hour L&D currently unable to accommodate inductions per 9pm safety  rounds Apology offered to patient she cannot be held in MAU until called for IOL  P: Discharge home in stable condition  Darlina Rumpf, [place] 10/11/2022 10:50 PM

## 2022-10-11 NOTE — Progress Notes (Signed)
Called patient to inform wait for IOL patient stated she SROM at 0330 clear fluid sporatic contractions and we advised she could go to MAU for evaluation. Patient stated she will stay home and wait for contractions to be more frquent

## 2022-10-11 NOTE — MAU Note (Signed)
..  Carrie Nunez is a 36 y.o. at [redacted]w[redacted]d here in MAU reporting: leaking of fluid since 0330 and irregular contractions. Reports she is an induction today and did not come in sooner because she was expecting a call to come in.  +FM Repots she lost her mucous plug.   Onset of complaint: 0330 Pain score: irregular

## 2022-10-12 ENCOUNTER — Inpatient Hospital Stay (HOSPITAL_COMMUNITY)
Admission: AD | Admit: 2022-10-12 | Discharge: 2022-10-13 | DRG: 807 | Disposition: A | Discharge status: Home / Self Care | Payer: 59 | Attending: Obstetrics and Gynecology | Admitting: Obstetrics and Gynecology

## 2022-10-12 ENCOUNTER — Inpatient Hospital Stay (HOSPITAL_COMMUNITY): Payer: 59 | Admitting: Anesthesiology

## 2022-10-12 ENCOUNTER — Other Ambulatory Visit: Payer: Self-pay

## 2022-10-12 ENCOUNTER — Encounter (HOSPITAL_COMMUNITY): Payer: Self-pay | Admitting: Obstetrics and Gynecology

## 2022-10-12 DIAGNOSIS — Z3490 Encounter for supervision of normal pregnancy, unspecified, unspecified trimester: Principal | ICD-10-CM

## 2022-10-12 DIAGNOSIS — O43123 Velamentous insertion of umbilical cord, third trimester: Secondary | ICD-10-CM | POA: Diagnosis present

## 2022-10-12 DIAGNOSIS — Z3A39 39 weeks gestation of pregnancy: Secondary | ICD-10-CM

## 2022-10-12 DIAGNOSIS — O26893 Other specified pregnancy related conditions, third trimester: Secondary | ICD-10-CM | POA: Diagnosis present

## 2022-10-12 LAB — CBC
HCT: 39.5 % (ref 36.0–46.0)
Hemoglobin: 13.8 g/dL (ref 12.0–15.0)
MCH: 29.7 pg (ref 26.0–34.0)
MCHC: 34.9 g/dL (ref 30.0–36.0)
MCV: 85.1 fL (ref 80.0–100.0)
Platelets: 246 10*3/uL (ref 150–400)
RBC: 4.64 MIL/uL (ref 3.87–5.11)
RDW: 13.3 % (ref 11.5–15.5)
WBC: 18.9 10*3/uL — ABNORMAL HIGH (ref 4.0–10.5)
nRBC: 0 % (ref 0.0–0.2)

## 2022-10-12 LAB — RPR: RPR Ser Ql: NONREACTIVE

## 2022-10-12 MED ORDER — LACTATED RINGERS IV SOLN: INTRAVENOUS | Status: DC

## 2022-10-12 MED ORDER — LIDOCAINE HCL (PF) 1 % IJ SOLN
INTRAMUSCULAR | Status: DC | PRN
  Administered 2022-10-12 (×2): 4 mL via EPIDURAL

## 2022-10-12 MED ORDER — PRENATAL MULTIVITAMIN CH
1.0000 | ORAL_TABLET | Freq: Every day | ORAL | Status: DC
  Filled 2022-10-12: qty 1

## 2022-10-12 MED ORDER — BENZOCAINE-MENTHOL 20-0.5 % EX AERO
1.0000 | INHALATION_SPRAY | CUTANEOUS | Status: DC | PRN
  Filled 2022-10-12: qty 56

## 2022-10-12 MED ORDER — RHO D IMMUNE GLOBULIN 1500 UNIT/2ML IJ SOSY
300.0000 ug | PREFILLED_SYRINGE | Freq: Once | INTRAMUSCULAR | Status: AC
  Administered 2022-10-13: 300 ug via INTRAVENOUS
  Filled 2022-10-12: qty 2

## 2022-10-12 MED ORDER — LACTATED RINGERS IV SOLN: 500.0000 mL | INTRAVENOUS | Status: DC | PRN

## 2022-10-12 MED ORDER — OXYCODONE-ACETAMINOPHEN 5-325 MG PO TABS: 1.0000 | ORAL_TABLET | ORAL | Status: DC | PRN

## 2022-10-12 MED ORDER — SOD CITRATE-CITRIC ACID 500-334 MG/5ML PO SOLN: 30.0000 mL | ORAL | Status: DC | PRN

## 2022-10-12 MED ORDER — DIPHENHYDRAMINE HCL 50 MG/ML IJ SOLN: 12.5000 mg | INTRAMUSCULAR | Status: DC | PRN

## 2022-10-12 MED ORDER — SENNOSIDES-DOCUSATE SODIUM 8.6-50 MG PO TABS
2.0000 | ORAL_TABLET | Freq: Every day | ORAL | Status: DC
  Administered 2022-10-13: 2 via ORAL
  Filled 2022-10-12: qty 2

## 2022-10-12 MED ORDER — OXYTOCIN BOLUS FROM INFUSION
333.0000 mL | Freq: Once | INTRAVENOUS | Status: AC
  Administered 2022-10-12: 333 mL via INTRAVENOUS

## 2022-10-12 MED ORDER — OXYCODONE-ACETAMINOPHEN 5-325 MG PO TABS: 2.0000 | ORAL_TABLET | ORAL | Status: DC | PRN

## 2022-10-12 MED ORDER — EPHEDRINE 5 MG/ML INJ: 10.0000 mg | INTRAVENOUS | Status: DC | PRN

## 2022-10-12 MED ORDER — MISOPROSTOL 25 MCG QUARTER TABLET: 25.0000 ug | ORAL_TABLET | ORAL | Status: DC | PRN

## 2022-10-12 MED ORDER — TERBUTALINE SULFATE 1 MG/ML IJ SOLN: 0.2500 mg | Freq: Once | INTRAMUSCULAR | Status: DC | PRN

## 2022-10-12 MED ORDER — DIBUCAINE (PERIANAL) 1 % EX OINT
1.0000 | TOPICAL_OINTMENT | CUTANEOUS | Status: DC | PRN
  Filled 2022-10-12: qty 28

## 2022-10-12 MED ORDER — OXYTOCIN-SODIUM CHLORIDE 30-0.9 UT/500ML-% IV SOLN
2.5000 [IU]/h | INTRAVENOUS | Status: DC
  Filled 2022-10-12: qty 500

## 2022-10-12 MED ORDER — PHENYLEPHRINE 80 MCG/ML (10ML) SYRINGE FOR IV PUSH (FOR BLOOD PRESSURE SUPPORT): 80.0000 ug | PREFILLED_SYRINGE | INTRAVENOUS | Status: DC | PRN

## 2022-10-12 MED ORDER — LIDOCAINE HCL (PF) 1 % IJ SOLN: 30.0000 mL | INTRAMUSCULAR | Status: DC | PRN

## 2022-10-12 MED ORDER — TETANUS-DIPHTH-ACELL PERTUSSIS 5-2.5-18.5 LF-MCG/0.5 IM SUSY: 0.5000 mL | PREFILLED_SYRINGE | Freq: Once | INTRAMUSCULAR | Status: DC

## 2022-10-12 MED ORDER — ACETAMINOPHEN 325 MG PO TABS
650.0000 mg | ORAL_TABLET | ORAL | Status: DC | PRN
  Filled 2022-10-12: qty 2

## 2022-10-12 MED ORDER — DIPHENHYDRAMINE HCL 25 MG PO CAPS: 25.0000 mg | ORAL_CAPSULE | Freq: Four times a day (QID) | ORAL | Status: DC | PRN

## 2022-10-12 MED ORDER — COCONUT OIL OIL: 1.0000 | TOPICAL_OIL | Status: DC | PRN

## 2022-10-12 MED ORDER — ONDANSETRON HCL 4 MG/2ML IJ SOLN: 4.0000 mg | INTRAMUSCULAR | Status: DC | PRN

## 2022-10-12 MED ORDER — ONDANSETRON HCL 4 MG PO TABS: 4.0000 mg | ORAL_TABLET | ORAL | Status: DC | PRN

## 2022-10-12 MED ORDER — ACETAMINOPHEN 325 MG PO TABS: 650.0000 mg | ORAL_TABLET | ORAL | Status: DC | PRN

## 2022-10-12 MED ORDER — ONDANSETRON HCL 4 MG/2ML IJ SOLN: 4.0000 mg | Freq: Four times a day (QID) | INTRAMUSCULAR | Status: DC | PRN

## 2022-10-12 MED ORDER — IBUPROFEN 600 MG PO TABS
600.0000 mg | ORAL_TABLET | Freq: Four times a day (QID) | ORAL | Status: DC
  Administered 2022-10-12 – 2022-10-13 (×3): 600 mg via ORAL
  Filled 2022-10-12 (×4): qty 1

## 2022-10-12 MED ORDER — SIMETHICONE 80 MG PO CHEW: 80.0000 mg | CHEWABLE_TABLET | ORAL | Status: DC | PRN

## 2022-10-12 MED ORDER — FENTANYL CITRATE (PF) 100 MCG/2ML IJ SOLN
50.0000 ug | Freq: Once | INTRAMUSCULAR | Status: AC
  Administered 2022-10-12: 50 ug via INTRAVENOUS
  Filled 2022-10-12: qty 2

## 2022-10-12 MED ORDER — FENTANYL-BUPIVACAINE-NACL 0.5-0.125-0.9 MG/250ML-% EP SOLN
EPIDURAL | Status: AC
  Filled 2022-10-12: qty 250

## 2022-10-12 MED ORDER — LACTATED RINGERS IV SOLN
500.0000 mL | Freq: Once | INTRAVENOUS | Status: AC
  Administered 2022-10-12: 500 mL via INTRAVENOUS

## 2022-10-12 MED ORDER — WITCH HAZEL-GLYCERIN EX PADS: 1.0000 | MEDICATED_PAD | CUTANEOUS | Status: DC | PRN

## 2022-10-12 MED ORDER — ZOLPIDEM TARTRATE 5 MG PO TABS: 5.0000 mg | ORAL_TABLET | Freq: Every evening | ORAL | Status: DC | PRN

## 2022-10-12 MED ORDER — FENTANYL-BUPIVACAINE-NACL 0.5-0.125-0.9 MG/250ML-% EP SOLN
12.0000 mL/h | EPIDURAL | Status: DC | PRN
  Administered 2022-10-12: 12 mL/h via EPIDURAL

## 2022-10-12 NOTE — Progress Notes (Addendum)
Somewhat more comfortable after epidural. Tired, some soreness still in pelvis. No strong urge to push yet CE complete/0 No palpable membranes on exam BP 119/74   Pulse 92   Ht 5\' 5"  (1.651 m)   Wt 79.8 kg   SpO2 95%   BMI 29.29 kg/m  Anticipate SVD  Of note - weak D antibody screen in [facility] patient who is s/p Rhogam on 7/27. Anti D was too weak to titer on 9/230

## 2022-10-12 NOTE — Anesthesia Procedure Notes (Signed)
Epidural Patient location during procedure: OB Start time: 10/12/2022 7:02 AM End time: 10/12/2022 7:06 AM  Staffing Anesthesiologist: Brennan Bailey, MD Performed: anesthesiologist   Preanesthetic Checklist Completed: patient identified, IV checked, risks and benefits discussed, monitors and equipment checked, pre-op evaluation and timeout performed  Epidural Patient position: sitting Prep: DuraPrep and site prepped and draped Patient monitoring: continuous pulse ox, blood pressure and heart rate Approach: midline Location: L3-L4 Injection technique: LOR air  Needle:  Needle type: Tuohy  Needle gauge: 17 G Needle length: 9 cm Needle insertion depth: 6 cm Catheter type: closed end flexible Catheter size: 19 Gauge Catheter at skin depth: 11 cm Test dose: negative and Other (1% lidocaine)  Assessment Events: blood not aspirated, injection not painful, no injection resistance, no paresthesia and negative IV test  Additional Notes Patient identified. Risks, benefits, and alternatives discussed with patient including but not limited to bleeding, infection, nerve damage, paralysis, failed block, incomplete pain control, headache, blood pressure changes, nausea, vomiting, reactions to medication, itching, and postpartum back pain. Confirmed with bedside nurse the patient's most recent platelet count. Confirmed with patient that they are not currently taking any anticoagulation, have any bleeding history, or any family history of bleeding disorders. Patient expressed understanding and wished to proceed. All questions were answered. Sterile technique was used throughout the entire procedure. Please see nursing notes for vital signs.   Crisp LOR after one needle redirection. Test dose was given through epidural catheter and negative prior to continuing to dose epidural or start infusion. Warning signs of high block given to the patient including shortness of breath, tingling/numbness in  hands, complete motor block, or any concerning symptoms with instructions to call for help. Patient was given instructions on fall risk and not to get out of bed. All questions and concerns addressed with instructions to call with any issues or inadequate analgesia.  Reason for block:procedure for pain

## 2022-10-12 NOTE — Progress Notes (Signed)
Dr. Wilhelmenia Blase notified of blood bank calling to let us know patient had an Anti D, she stated to go ahead and give rhogam. Order put in.

## 2022-10-12 NOTE — H&P (Signed)
36 y.o. [redacted]w[redacted]d  [id number] comes in c/o contractions.  Otherwise has good fetal movement and no bleeding.  Was scheduled for IOL 10/12 but was not called in.  Pt thought her water broke so came for eval last evening, was found not to be ruptured and discharged home to await IOL call-in.  Past Medical History:  Diagnosis Date   Abortion, spontaneous 2011   Medical history non-contributory    No pertinent past medical history    Ovarian cyst     Past Surgical History:  Procedure Laterality Date   BREAST BIOPSY     patient states a lump is tagged in breast   DILATION AND EVACUATION  08/25/2012   Procedure: DILATATION AND EVACUATION;  Surgeon: Sharene Butters, MD;  Location: [facility];  Service: Gynecology;  Laterality: N/A;   VAGINAL DELIVERY  2005. 2010    OB History  Gravida Para Term Preterm AB Living  7 4 4  0 2 4  SAB IAB Ectopic Multiple Live Births  2 0 0 0 2    # Outcome Date GA Lbr Len/2nd Weight Sex Delivery Anes PTL Lv  7 Current           6 Term 03/25/20 [redacted]w[redacted]d 08:47 / 00:21 4241 g M Vag-Spont EPI  LIV  5 Term 09/18/13 [redacted]w[redacted]d 07:35 / 00:34 3209 g F Vag-Spont EPI  LIV  4 Term 2010     Vag-Spont     3 Term 2005     Vag-Spont     2 SAB           1 SAB             Social History   Socioeconomic History   Marital status: Married    Spouse name: Not on file   Number of children: Not on file   Years of education: Not on file   Highest education level: Not on file  Occupational History   Not on file  Tobacco Use   Smoking status: Never   Smokeless tobacco: Never  Vaping Use   Vaping Use: Never used  Substance and Sexual Activity   Alcohol use: No   Drug use: No   Sexual activity: Yes  Other Topics Concern   Not on file  Social History Narrative   Not on file   Social Determinants of Health   Financial Resource Strain: Not on file  Food Insecurity: Not on file  Transportation Needs: Not on file  Physical Activity: Not on file  Stress: Not on file  Social  Connections: Not on file  Intimate Partner Violence: Not on file   Penicillins    Prenatal Transfer Tool  Maternal Diabetes: No Genetic Screening: Normal Maternal Ultrasounds/Referrals: Other: marginal cord insertion, 32 week growth scan 45% Fetal Ultrasounds or other Referrals:  None Maternal Substance Abuse:  No Significant Maternal Medications:  None Significant Maternal Lab Results: Group B Strep negative  Other PNC: AMA, marginal cord insertion, Anti-D Ab+, titer consistently too low to titer    There were no vitals filed for this visit.  Lungs/Cor:  NAD Abdomen:  soft, gravid Ex:  no cords, erythema SVE:  6/90/0 FHTs:  135 , good STV, NST R; Cat 1 tracing. Toco:  q 2   A/P   Admit to L&D with term labor  GBS Neg  Pt desires epidural, while waiting requests IV pain meds- 50 fentanyl administered  Other routine care.  Allyn Kenner

## 2022-10-12 NOTE — Plan of Care (Signed)
Resean Brander, RN 

## 2022-10-12 NOTE — Anesthesia Preprocedure Evaluation (Addendum)
Anesthesia Evaluation  Patient identified by MRN, date of birth, ID band Patient awake    Reviewed: Allergy & Precautions, H&P , Patient's Chart, lab work & pertinent test results  History of Anesthesia Complications Negative for: history of anesthetic complications  Airway Mallampati: II  TM Distance: >3 FB Neck ROM: Full    Dental no notable dental hx.    Pulmonary neg pulmonary ROS,    Pulmonary exam normal        Cardiovascular negative cardio ROS   Rhythm:regular Rate:Normal     Neuro/Psych negative neurological ROS  negative psych ROS   GI/Hepatic negative GI ROS, Neg liver ROS,   Endo/Other  negative endocrine ROS  Renal/GU negative Renal ROS  negative genitourinary   Musculoskeletal   Abdominal   Peds  Hematology negative hematology ROS (+)   Anesthesia Other Findings   Reproductive/Obstetrics (+) Pregnancy                            Anesthesia Physical Anesthesia Plan  ASA: 2  Anesthesia Plan: Epidural   Post-op Pain Management:    Induction:   PONV Risk Score and Plan: Treatment may vary due to age or medical condition  Airway Management Planned: Natural Airway  Additional Equipment: Fetal Monitoring  Intra-op Plan:   Post-operative Plan:   Informed Consent: I have reviewed the patients History and Physical, chart, labs and discussed the procedure including the risks, benefits and alternatives for the proposed anesthesia with the patient or authorized representative who has indicated his/her understanding and acceptance.       Plan Discussed with:   Anesthesia Plan Comments:        Anesthesia Quick Evaluation

## 2022-10-12 NOTE — MAU Note (Signed)
..  Carrie Nunez is a 36 y.o. at [redacted]w[redacted]d here in MAU reporting: pt reports constant contractions and pressure with contractions.   Pt unable to lay down due to intensity of contractions. FHR obtained. This RN attempted to check cervix on left lateral , but unable to. Requested CNM to come to bedside for cervical exam.   Hurshel Keys at bedside. SVE 6 90% 0station

## 2022-10-13 LAB — CBC
HCT: 36.5 % (ref 36.0–46.0)
Hemoglobin: 12 g/dL (ref 12.0–15.0)
MCH: 28.8 pg (ref 26.0–34.0)
MCHC: 32.9 g/dL (ref 30.0–36.0)
MCV: 87.7 fL (ref 80.0–100.0)
Platelets: 217 10*3/uL (ref 150–400)
RBC: 4.16 MIL/uL (ref 3.87–5.11)
RDW: 13.6 % (ref 11.5–15.5)
WBC: 13.1 10*3/uL — ABNORMAL HIGH (ref 4.0–10.5)
nRBC: 0 % (ref 0.0–0.2)

## 2022-10-13 LAB — BIRTH TISSUE RECOVERY COLLECTION (PLACENTA DONATION)

## 2022-10-13 MED ORDER — IBUPROFEN 600 MG PO TABS: 600.0000 mg | ORAL_TABLET | Freq: Four times a day (QID) | ORAL | 0 refills | Status: DC

## 2022-10-13 NOTE — Discharge Instructions (Signed)
As per discharge pamphlet °

## 2022-10-13 NOTE — Progress Notes (Signed)
PPD #1 No problems, would like to go home today if possible Afeb, VSS Fundus firm, NT at U-1 Continue routine postpartum care, d/c home if baby ok to go

## 2022-10-13 NOTE — Discharge Summary (Signed)
Postpartum Discharge Summary      Patient Name: Pernella Jarrett DOB: [birth year] MRN: [id number]  Date of admission: 10/12/2022 Delivery date:10/12/2022  Delivering provider: Deliah Boston  Date of discharge: 10/13/2022  Admitting diagnosis: Pregnant and not yet delivered [Z34.90] Intrauterine pregnancy: [redacted]w[redacted]d     Secondary diagnosis:  Principal Problem:   Pregnant and not yet delivered     Discharge diagnosis: Term Pregnancy [facility] course: Onset of Labor With Vaginal Delivery      36 y.o. yo [id number] at [redacted]w[redacted]d was admitted in Latent Labor on 10/12/2022. Labor course was uncomplicated.  Membrane Rupture Time/Date: 8:00 AM ,10/12/2022   Delivery Method:Vaginal, Spontaneous  Episiotomy: None  Lacerations:  None  Patient had a postpartum course that was uncomplicated.  She is ambulating, tolerating a regular diet, passing flatus, and urinating well. Patient is discharged home in stable condition on 10/13/22.  Newborn Data: Birth date:10/12/2022  Birth time:9:21 AM  Gender:Female  Living status:Living  Apgars:9 ,9  Weight:3390 g   Physical exam  Vitals:   10/12/22 1633 10/12/22 2045 10/13/22 0030 10/13/22 0610  BP: 113/68 112/65 103/69 98/64  Pulse: 70 84 73 68  Resp: 18 18 18 18   Temp: 98 F (36.7 C) 99 F (37.2 C) 98.7 F (37.1 C) 98.8 F (37.1 C)  TempSrc:  Oral Oral Oral  SpO2: 99% 100% 99% 99%  Weight:      Height:       General: alert Lochia: appropriate Uterine Fundus: firm  Labs: Lab Results  Component Value Date   WBC 13.1 (H) 10/13/2022   HGB 12.0 10/13/2022   HCT 36.5 10/13/2022   MCV 87.7 10/13/2022   PLT 217 10/13/2022      Latest Ref Rng & Units 02/23/2011    2:01 AM  CMP  Glucose 70 - 99 mg/dL 95   BUN 6 - 23 mg/dL 13   Creatinine 0.4 - 1.2 mg/dL 0.68   Sodium 135 - 145 mEq/L 143   Potassium 3.5 - 5.1 mEq/L 3.7   Chloride 96 - 112 mEq/L 107   CO2 19 - 32 mEq/L 27   Calcium  8.4 - 10.5 mg/dL 9.2    Edinburgh Score:    10/12/2022   11:46 AM  Edinburgh Postnatal Depression Scale Screening Tool  I have been able to laugh and see the funny side of things. 0  I have looked forward with enjoyment to things. 0  I have blamed myself unnecessarily when things went wrong. 0  I have been anxious or worried for no good reason. 0  I have felt scared or panicky for no good reason. 0  Things have been getting on top of me. 1  I have been so unhappy that I have had difficulty sleeping. 0  I have felt sad or miserable. 0  I have been so unhappy that I have been crying. 0  The thought of harming myself has occurred to me. 0  Edinburgh Postnatal Depression Scale Total 1      After visit meds:  Allergies as of 10/13/2022       Reactions   Azithromycin Rash   Penicillins Rash   Has been taking amoxicillin without any problems, Patient  says she's no longer allergic        Medication List     TAKE these medications    ibuprofen 600 MG tablet Commonly known as: ADVIL Take 1 tablet (600 mg total) by mouth every 6 (six) hours.   PRENATAL PO Take 2 tablets by mouth daily.         Discharge home in stable condition Infant Feeding: Breast Infant Disposition:home with mother Discharge instruction: per After Visit Summary and Postpartum booklet. Activity: Advance as tolerated. Pelvic rest for 6 weeks.  Diet: routine diet Postpartum Appointment:4 weeks Follow up Visit:  Follow-up Information     Bobbye Charleston, MD. Schedule an appointment as soon as possible for a visit in 4 week(s).   Specialty: Obstetrics and Gynecology Contact information: [address] [place] [address] [phone]                     10/13/2022 Clarene Duke, MD

## 2022-10-13 NOTE — Anesthesia Postprocedure Evaluation (Signed)
Anesthesia Post Note  Patient: Carrie Nunez  Procedure(s) Performed: AN AD Anderson     Patient location during evaluation: Mother Baby Anesthesia Type: Epidural Level of consciousness: awake and alert Pain management: pain level controlled Vital Signs Assessment: post-procedure vital signs reviewed and stable Respiratory status: spontaneous breathing, nonlabored ventilation and respiratory function stable Cardiovascular status: stable Postop Assessment: no headache, no backache and epidural receding Anesthetic complications: no   No notable events documented.  Last Vitals:  Vitals:   10/13/22 0030 10/13/22 0610  BP: 103/69 98/64  Pulse: 73 68  Resp: 18 18  Temp: 37.1 C 37.1 C  SpO2: 99% 99%    Last Pain:  Vitals:   10/13/22 1236  TempSrc:   PainSc: 0-No pain   Pain Goal:                   Stefani Dama

## 2022-10-13 NOTE — Lactation Note (Signed)
This note was copied from a baby's chart. Lactation Consultation Note  Patient Name: Carrie Nunez Today's Date: 10/13/2022 Reason for consult: Initial assessment;Term;Infant weight loss Age:37 hours  LC in to visit with P5 Mom of term baby.  Baby was latched in cradle hold with a deep latch when LC entered room.  Mom denies having any difficulty with latching.  Mom is still breastfeeding at night, her toddler and plans to tandem nurse when she gets home.    Encouraged continued STS and frequent breastfeeding with cues. Baby's output is great. Stools are transitioning to yellow. Baby's weight loss is at a 6% weight loss.  Baby has breast fed 11 times since birth.  Baby has a + DAT, bilirubin levels have been low.   Mom aware of IP and OP lactation support available to her.  Encouraged to call prn for assistance.  Maternal Data Has patient been taught Hand Expression?: Yes Does the patient have breastfeeding experience prior to this delivery?: Yes How long did the patient breastfeed?: breastfed all her babies and still breastfeeding her 2.5 yr old at night  Feeding Mother's Current Feeding Choice: Breast Milk  LATCH Score Latch: Grasps breast easily, tongue down, lips flanged, rhythmical sucking.  Audible Swallowing: Spontaneous and intermittent  Type of Nipple: Everted at rest and after stimulation  Comfort (Breast/Nipple): Soft / non-tender  Hold (Positioning): No assistance needed to correctly position infant at breast.  LATCH Score: 10   Interventions Interventions: Breast feeding basics reviewed;Skin to skin;Breast massage;Hand express;LC Services brochure  Discharge Pump: DEBP;Personal (unsure of brand)  Consult Status Consult Status: Follow-up Date: 10/14/22 Follow-up type: [place] 10/13/2022, 11:02 AM

## 2022-10-14 LAB — TYPE AND SCREEN
ABO/RH(D): A NEG
Antibody Screen: POSITIVE
Unit division: 0

## 2022-10-14 LAB — RH IG WORKUP (INCLUDES ABO/RH)
Fetal Screen: NEGATIVE
Gestational Age(Wks): 39
Unit division: 0

## 2022-10-14 LAB — BPAM RBC
Blood Product Expiration Date: 202311172359
Unit Type and Rh: 9500

## 2022-10-20 ENCOUNTER — Telehealth (HOSPITAL_COMMUNITY): Payer: Self-pay | Admitting: *Deleted

## 2022-10-20 NOTE — Telephone Encounter (Signed)
Mom reports feeling good. No concerns about herself at this time. EPDS not completed as mom reports feeling well emotionally. [facility] score=1) Mom reports baby is doing well. Feeding, peeing, and pooping without difficulty. Safe sleep reviewed. Mom reports no concerns about baby at present.  Odis Hollingshead, RN 10-20-2022 at 2:00pm

## 2024-10-30 ENCOUNTER — Encounter: Payer: Self-pay | Admitting: Physician Assistant

## 2024-10-30 ENCOUNTER — Ambulatory Visit

## 2024-10-30 ENCOUNTER — Ambulatory Visit: Attending: Physician Assistant | Admitting: Physician Assistant

## 2024-10-30 VITALS — BP 110/70 | HR 102 | Ht 65.0 in | Wt 143.6 lb

## 2024-10-30 DIAGNOSIS — R0609 Other forms of dyspnea: Secondary | ICD-10-CM

## 2024-10-30 DIAGNOSIS — R002 Palpitations: Secondary | ICD-10-CM

## 2024-10-30 NOTE — Progress Notes (Signed)
 Cardiology Office Note   Date:  10/30/2024  ID:  Carrie Nunez, DOB [birth year], MRN [id number] PCP: Redmon, Noelle, PA  [facility] Providers Cardiologist:  [facility] - Dr. Kriste  History of Present Illness Carrie Nunez is a 38 y.o. female with no past medical history who was referred to cardiology service by PCP for evaluation of palpitation.  PCP recently evaluated the patient for anxiety.  Symptom has been going on for the past 2 months.  She is a mother of 5.  Her maternal grandfather died of MI at age 46, both parents are living, father has A-fib.  She experimented with smoking in high school for 2 years and quit at age 46.  She does not do any illicit drug.  She drinks a glass of wine twice a month.  Based on referral note, last blood work showed normal renal function and electrolyte, normal liver enzyme.  Normal hemoglobin, borderline elevated red blood cell count, normal white blood cell count.  Mildly elevated absolute neutrophil count.  Patient presents today for [facility] clinic evaluation for palpitation.  Symptom feels more like intermittent skipped heartbeat however can last up to several days at a time.  She has occasional episodes that is severe enough where she feels like she was going to pass out however never truly passed out.  Symptom does not have clear correlation with exertion, and can occur both at rest and with exertion.  She only drinks decaffeinated tea.  She is concerned that she may be going into perimenopause this would be very early for her age.  She has upcoming visit with GYN service in January or February, I will defer additional workup for perimenopause to her GYN provider.  CBC and CMP were normal.  I will obtain a TSH and a free T4.  I recommended a 2-week heart monitor to further assess.  She will also have an echocardiogram as she was complaining of some mild worsening dyspnea on exertion.  She denies any chest pain.  EKG obtained at PCPs office  showed normal sinus rhythm, minimal T wave inversion in V1 and V2.  She can ride bicycles without any exertional chest pain.  Her previous pregnancy were normal.  ROS:   Patient complains of palpitation and shortness of breath, she denies any chest pain.  She has no lower extremity edema, orthopnea or PND.  Studies Reviewed          Risk Assessment/Calculations           Physical Exam VS:  BP 110/70   Pulse (!) 102   Ht 5' 5 (1.651 m)   Wt 143 lb 9.6 oz (65.1 kg)   SpO2 98%   BMI 23.90 kg/m        Wt Readings from Last 3 Encounters:  10/30/24 143 lb 9.6 oz (65.1 kg)  10/12/22 176 lb (79.8 kg)  10/11/22 176 lb (79.8 kg)    GEN: Well nourished, well developed in no acute distress NECK: No JVD; No carotid bruits CARDIAC: RRR, no murmurs, rubs, gallops RESPIRATORY:  Clear to auscultation without rales, wheezing or rhonchi  ABDOMEN: Soft, non-tender, non-distended EXTREMITIES:  No edema; No deformity   ASSESSMENT AND PLAN  Palpitation: Recommended 2-week ZIO monitor.  Obtain TSH and a free T4.  She has upcoming visit with OB/GYN service, patient is concerned about possibility of perimenopause, will defer additional evaluation to GYN service.  Dyspnea on exertion: Obtain echocardiogram.       Dispo: Follow-up in  6 weeks.  Signed, Scot Ford, PA

## 2024-10-30 NOTE — Progress Notes (Unsigned)
 Enrolled patient for a 14 day Zio XT monitor to be mailed to patients home  Carrie Nunez to read

## 2024-10-30 NOTE — Patient Instructions (Signed)
 Medication Instructions:  Your physician recommends that you continue on your current medications as directed. Please refer to the Current Medication list given to you today.  *If you need a refill on your cardiac medications before your next appointment, please call your pharmacy*  Lab Work: TODAY:  TSH & FREE T4  If you have labs (blood work) drawn today and your tests are completely normal, you will receive your results only by: MyChart Message (if you have MyChart) OR A paper copy in the mail If you have any lab test that is abnormal or we need to change your treatment, we will call you to review the results.  Testing/Procedures: Your physician has requested that you have an echocardiogram. Echocardiography is a painless test that uses sound waves to create images of your heart. It provides your doctor with information about the size and shape of your heart and how well your heart's chambers and valves are working. This procedure takes approximately one hour. There are no restrictions for this procedure. Please do NOT wear cologne, perfume, aftershave, or lotions (deodorant is allowed). Please arrive 15 minutes prior to your appointment time.  Please note: We ask at that you not bring children with you during ultrasound (echo/ vascular) testing. Due to room size and safety concerns, children are not allowed in the ultrasound rooms during exams. Our front office staff cannot provide observation of children in our lobby area while testing is being conducted. An adult accompanying a patient to their appointment will only be allowed in the ultrasound room at the discretion of the ultrasound technician under special circumstances. We apologize for any inconvenience.   ZIO XT- Long Term Monitor Instructions  Your physician has requested you wear a ZIO patch monitor for 14 days.  This is a single patch monitor. [employer] supplies one patch monitor per enrollment. Additional stickers are not  available. Please do not apply patch if you will be having a Nuclear Stress Test,  Echocardiogram, Cardiac CT, MRI, or Chest Xray during the period you would be wearing the  monitor. The patch cannot be worn during these tests. You cannot remove and re-apply the  ZIO XT patch monitor.  Your ZIO patch monitor will be mailed 3 day USPS to your address on file. It may take 3-5 days  to receive your monitor after you have been enrolled.  Once you have received your monitor, please review the enclosed instructions. Your monitor  has already been registered assigning a specific monitor serial # to you.  Billing and Patient Assistance Program Information  We have supplied [employer] with any of your insurance information on file for billing purposes. [employer] offers a sliding scale Patient Assistance Program for patients that do not have  insurance, or whose insurance does not completely cover the cost of the ZIO monitor.  You must apply for the Patient Assistance Program to qualify for this discounted rate.  To apply, please call [employer] at [phone], select option 4, select option 2, ask to apply for  Patient Assistance Program. Meredeth will ask your household income, and how many people  are in your household. They will quote your out-of-pocket cost based on that information.  [employer] will also be able to set up a 40-month, interest-free payment plan if needed.  Applying the monitor   Shave hair from upper left chest.  Hold abrader disc by orange tab. Rub abrader in 40 strokes over the upper left chest as  indicated in your monitor instructions.  Clean area  with 4 enclosed alcohol pads. Let dry.  Apply patch as indicated in monitor instructions. Patch will be placed under collarbone on left  side of chest with arrow pointing upward.  Rub patch adhesive wings for 2 minutes. Remove white label marked 1. Remove the white  label marked 2. Rub patch adhesive wings for 2 additional minutes.   While looking in a mirror, press and release button in center of patch. A small green light will  flash 3-4 times. This will be your only indicator that the monitor has been turned on.  Do not shower for the first 24 hours. You may shower after the first 24 hours.  Press the button if you feel a symptom. You will hear a small click. Record Date, Time and  Symptom in the Patient Logbook.  When you are ready to remove the patch, follow instructions on the last 2 pages of Patient  Logbook. Stick patch monitor onto the last page of Patient Logbook.  Place Patient Logbook in the blue and white box. Use locking tab on box and tape box closed  securely. The blue and white box has prepaid postage on it. Please place it in the mailbox as  soon as possible. Your physician should have your test results approximately 7 days after the  monitor has been mailed back to [facility].  Call [facility] Customer Care at [phone] if you have questions regarding  your ZIO XT patch monitor. Call them immediately if you see an orange light blinking on your  monitor.  If your monitor falls off in less than 4 days, contact our Monitor department at [phone].  If your monitor becomes loose or falls off after 4 days call [employer] at [phone] for  suggestions on securing your monitor   Follow-Up: At [facility], you and your health needs are our priority.  As part of our continuing mission to provide you with exceptional heart care, our providers are all part of one team.  This team includes your primary Cardiologist (physician) and Advanced Practice Providers or APPs (Physician Assistants and Nurse Practitioners) who all work together to provide you with the care you need, when you need it.  Your next appointment:   6 week(s)  Provider:   Scot Ford, PA-C          We recommend signing up for the patient portal called MyChart.  Sign up information is provided on this After Visit  Summary.  MyChart is used to connect with patients for Virtual Visits (Telemedicine).  Patients are able to view lab/test results, encounter notes, upcoming appointments, etc.  Non-urgent messages can be sent to your provider as well.   To learn more about what you can do with MyChart, go to [email or web address].   Other Instructions

## 2024-10-31 LAB — TSH: TSH: 0.891 u[IU]/mL (ref 0.450–4.500)

## 2024-10-31 LAB — T4, FREE: Free T4: 1.68 ng/dL (ref 0.82–1.77)

## 2024-11-02 ENCOUNTER — Ambulatory Visit: Payer: Self-pay | Admitting: Physician Assistant

## 2024-11-02 NOTE — Progress Notes (Signed)
 Normal thyroid level

## 2024-12-07 ENCOUNTER — Ambulatory Visit (HOSPITAL_COMMUNITY)
Admission: RE | Admit: 2024-12-07 | Discharge: 2024-12-07 | Disposition: A | Source: Ambulatory Visit | Attending: Physician Assistant

## 2024-12-07 DIAGNOSIS — R002 Palpitations: Secondary | ICD-10-CM

## 2024-12-07 DIAGNOSIS — R0609 Other forms of dyspnea: Secondary | ICD-10-CM

## 2024-12-07 LAB — ECHOCARDIOGRAM COMPLETE
AR max vel: 2.67 cm2
AV Area VTI: 2.63 cm2
AV Area mean vel: 2.6 cm2
AV Mean grad: 5 mmHg
AV Peak grad: 8.8 mmHg
Ao pk vel: 1.48 m/s
Area-P 1/2: 4.63 cm2
MV M vel: 1.02 m/s
MV Peak grad: 4.2 mmHg
S' Lateral: 2.55 cm

## 2024-12-10 ENCOUNTER — Ambulatory Visit: Attending: Internal Medicine | Admitting: Physician Assistant

## 2024-12-10 ENCOUNTER — Encounter: Payer: Self-pay | Admitting: Physician Assistant

## 2024-12-10 VITALS — BP 132/64 | HR 100 | Ht 65.0 in | Wt 150.4 lb

## 2024-12-10 DIAGNOSIS — R002 Palpitations: Secondary | ICD-10-CM

## 2024-12-10 NOTE — Progress Notes (Signed)
 Cardiology Office Note   Date:  12/10/2024  ID:  Carrie Nunez, DOB [birth year], MRN [id number] PCP: Orlando Dwayne NOVAK, FNP  [facility] Providers Cardiologist:  Emeline FORBES Calender, DO     History of Present Illness Carrie Nunez is a 38 y.o. female with no past medical history who was referred to cardiology service for evaluation of palpitation.  Primary care provider evaluated the patient for anxiety.  The symptom has been going on since August.  She is a mother of 5.  Maternal grandfather died of MI at age 32, both parents are living.  Father has A-fib.  She described palpitation as intermittent skipped heartbeat that can last several days at a time.  She has occasional episodes that severe enough where she feels like she was going to pass out however never truly passed out.  I obtained TSH and free T4 which came back normal.  Echocardiogram obtained on 12/07/2024 showed EF 60 to 65%, no regional wall motion abnormality, normal RV, no significant valve issue.  Patient presents today for follow-up.  Heart rate continued to be borderline high.  She had an episode of palpitation on Thanksgiving however symptom resolved by itself.  She did find out she was pregnant, which likely explain the borderline fast heart rate and weight gain.  Pregnancy may also contribute to palpitation as well.  I reviewed the recent echocardiogram with the patient.  She is still wearing the heart monitor, if the heart monitor come back okay, I would not recommend any further workup.  She denies having any major chest pain or shortness of breath.  I recommended 7-month follow-up with Dr. Calender, however if heart monitor came back okay, she is okay to cancel her visit with Dr. Calender and to follow-up as needed.  ROS:   Patient complains of palpitation.  She denies any chest pain or shortness of breath.  Studies Reviewed      Cardiac Studies & Procedures    ______________________________________________________________________________________________     ECHOCARDIOGRAM  ECHOCARDIOGRAM COMPLETE 12/07/2024  Narrative ECHOCARDIOGRAM REPORT    Patient Name:   Carrie Nunez     Date of Exam: 12/07/2024 Medical Rec #:  [id number]       Height:       65.0 in Accession #:    [id number]      Weight:       143.6 lb Date of Birth:  1986-01-20       BSA:          1.718 m Patient Age:    38 years        BP:           108/77 mmHg Patient Gender: F               HR:           87 bpm. Exam Location:  [address]  Procedure: 2D Echo, Cardiac Doppler and Color Doppler (Both Spectral and Color Flow Doppler were utilized during procedure).  Indications:    Palpitations  History:        Patient has no prior history of Echocardiogram examinations. Risk Factors:Non-Smoker.  Sonographer:    Rosaline Fujisawa MHA, RDMS, RVT, RDCS Referring Phys: [phone] Teren Zurcher  IMPRESSIONS   1. Left ventricular ejection fraction, by estimation, is 60 to 65%. The left ventricle has normal function. The left ventricle has no regional wall motion abnormalities. Left ventricular diastolic parameters were normal. 2. Right ventricular systolic function is normal. The right ventricular  size is normal. There is normal pulmonary artery systolic pressure. 3. The mitral valve is normal in structure. No evidence of mitral valve regurgitation. No evidence of mitral stenosis. 4. The aortic valve is tricuspid. Aortic valve regurgitation is not visualized. Aortic valve sclerosis/calcification is present, without any evidence of aortic stenosis. Aortic valve area, by VTI measures 2.63 cm. Aortic valve mean gradient measures 5.0 mmHg. Aortic valve Vmax measures 1.48 m/s. 5. The inferior vena cava is normal in size with greater than 50% respiratory variability, suggesting right atrial pressure of 3 mmHg.  FINDINGS Left Ventricle: Left ventricular ejection fraction, by estimation,  is 60 to 65%. The left ventricle has normal function. The left ventricle has no regional wall motion abnormalities. The left ventricular internal cavity size was normal in size. There is no left ventricular hypertrophy. Left ventricular diastolic parameters were normal. Normal left ventricular filling pressure.  Right Ventricle: The right ventricular size is normal. No increase in right ventricular wall thickness. Right ventricular systolic function is normal. There is normal pulmonary artery systolic pressure. The tricuspid regurgitant velocity is 1.37 m/s, and with an assumed right atrial pressure of 3 mmHg, the estimated right ventricular systolic pressure is 10.5 mmHg.  Left Atrium: Left atrial size was normal in size.  Right Atrium: Right atrial size was normal in size.  Pericardium: There is no evidence of pericardial effusion.  Mitral Valve: The mitral valve is normal in structure. No evidence of mitral valve regurgitation. No evidence of mitral valve stenosis.  Tricuspid Valve: The tricuspid valve is normal in structure. Tricuspid valve regurgitation is trivial. No evidence of tricuspid stenosis.  Aortic Valve: The aortic valve is tricuspid. Aortic valve regurgitation is not visualized. Aortic valve sclerosis/calcification is present, without any evidence of aortic stenosis. Aortic valve mean gradient measures 5.0 mmHg. Aortic valve peak gradient measures 8.8 mmHg. Aortic valve area, by VTI measures 2.63 cm.  Pulmonic Valve: The pulmonic valve was normal in structure. Pulmonic valve regurgitation is trivial. No evidence of pulmonic stenosis.  Aorta: The aortic root is normal in size and structure.  Venous: The inferior vena cava is normal in size with greater than 50% respiratory variability, suggesting right atrial pressure of 3 mmHg.  IAS/Shunts: No atrial level shunt detected by color flow Doppler.   LEFT VENTRICLE PLAX 2D LVIDd:         4.27 cm   Diastology LVIDs:          2.55 cm   LV e' medial:    9.79 cm/s LV PW:         0.84 cm   LV E/e' medial:  6.8 LV IVS:        0.50 cm   LV e' lateral:   17.10 cm/s LVOT diam:     1.96 cm   LV E/e' lateral: 3.9 LV SV:         69 LV SV Index:   40 LVOT Area:     3.02 cm   RIGHT VENTRICLE             IVC RV Basal diam:  2.80 cm     IVC diam: 1.53 cm RV Mid diam:    2.08 cm RV S prime:     18.30 cm/s TAPSE (M-mode): 2.6 cm  LEFT ATRIUM             Index        RIGHT ATRIUM           Index LA diam:  1.94 cm 1.13 cm/m   RA Area:     10.20 cm LA Vol (A2C):   28.7 ml 16.70 ml/m  RA Volume:   20.40 ml  11.87 ml/m LA Vol (A4C):   15.5 ml 9.02 ml/m LA Biplane Vol: 20.4 ml 11.87 ml/m AORTIC VALVE AV Area (Vmax):    2.67 cm AV Area (Vmean):   2.60 cm AV Area (VTI):     2.63 cm AV Vmax:           148.00 cm/s AV Vmean:          99.100 cm/s AV VTI:            0.264 m AV Peak Grad:      8.8 mmHg AV Mean Grad:      5.0 mmHg LVOT Vmax:         131.00 cm/s LVOT Vmean:        85.300 cm/s LVOT VTI:          0.230 m LVOT/AV VTI ratio: 0.87  AORTA Ao Root diam: 2.67 cm Ao Asc diam:  2.48 cm  MITRAL VALVE               TRICUSPID VALVE MV Area (PHT): 4.63 cm    TR Peak grad:   7.5 mmHg MV Decel Time: 164 msec    TR Vmax:        137.00 cm/s MR Peak grad: 4.2 mmHg MR Vmax:      102.00 cm/s  SHUNTS MV E velocity: 66.20 cm/s  Systemic VTI:  0.23 m MV A velocity: 63.40 cm/s  Systemic Diam: 1.96 cm MV E/A ratio:  1.04  Wilbert Bihari MD Electronically signed by Wilbert Bihari MD Signature Date/Time: 12/07/2024/1:56:42 PM    Final          ______________________________________________________________________________________________      Risk Assessment/Calculations           Physical Exam VS:  BP 132/64 (BP Location: Right Arm, Patient Position: Sitting, Cuff Size: Normal)   Pulse 100   Ht 5' 5 (1.651 m)   Wt 150 lb 6.4 oz (68.2 kg)   SpO2 99%   BMI 25.03 kg/m        Wt Readings from  Last 3 Encounters:  12/10/24 150 lb 6.4 oz (68.2 kg)  10/30/24 143 lb 9.6 oz (65.1 kg)  10/12/22 176 lb (79.8 kg)    GEN: Well nourished, well developed in no acute distress NECK: No JVD; No carotid bruits CARDIAC: RRR, no murmurs, rubs, gallops RESPIRATORY:  Clear to auscultation without rales, wheezing or rhonchi  ABDOMEN: Soft, non-tender, non-distended EXTREMITIES:  No edema; No deformity   ASSESSMENT AND PLAN  Palpitation: Recent echocardiogram was normal.  Patient is currently wearing a heart monitor.  She described a prolonged episode of palpitation during Thanksgiving, symptom has resolved.  Palpitation could be related to anxiety or arrhythmia.  She recently found out she was pregnant which also can contribute to palpitation as well.  If heart monitor is negative, she may cancel her upcoming visit with Dr. Kriste        Dispo: Follow-up with Dr. Kriste in 3 months, however if heart monitor come back normal, she may cancel her visit with Dr. Segea.  Signed, Scot Ford, PA

## 2024-12-10 NOTE — Patient Instructions (Signed)
 Medication Instructions:  NO CHANGES *If you need a refill on your cardiac medications before your next appointment, please call your pharmacy*  Lab Work: NO LABS If you have labs (blood work) drawn today and your tests are completely normal, you will receive your results only by: MyChart Message (if you have MyChart) OR A paper copy in the mail If you have any lab test that is abnormal or we need to change your treatment, we will call you to review the results.  Testing/Procedures: NO TESTING  Follow-Up: At [facility], you and your health needs are our priority.  As part of our continuing mission to provide you with exceptional heart care, our providers are all part of one team.  This team includes your primary Cardiologist (physician) and Advanced Practice Providers or APPs (Physician Assistants and Nurse Practitioners) who all work together to provide you with the care you need, when you need it.  Your next appointment:   3 month(s)  Provider:   [name], DO  We recommend signing up for the patient portal called MyChart.  Sign up information is provided on this After Visit Summary.  MyChart is used to connect with patients for Virtual Visits (Telemedicine).  Patients are able to view lab/test results, encounter notes, upcoming appointments, etc.  Non-urgent messages can be sent to your provider as well.   To learn more about what you can do with MyChart, go to [email or web address].

## 2025-01-14 DIAGNOSIS — R0609 Other forms of dyspnea: Secondary | ICD-10-CM

## 2025-01-14 DIAGNOSIS — R002 Palpitations: Secondary | ICD-10-CM | POA: Diagnosis not present

## 2025-01-20 NOTE — Progress Notes (Signed)
 Spoke with patient regarding heart monitor. Patient is aware that March appt has been cancelled and to call when needed for cardiac concerns

## 2025-02-04 ENCOUNTER — Ambulatory Visit: Admitting: Internal Medicine

## 2025-02-04 ENCOUNTER — Encounter: Payer: Self-pay | Admitting: Internal Medicine

## 2025-02-04 VITALS — BP 110/64 | HR 122 | Ht 65.0 in | Wt 161.0 lb

## 2025-02-04 DIAGNOSIS — F419 Anxiety disorder, unspecified: Secondary | ICD-10-CM | POA: Diagnosis not present

## 2025-02-04 DIAGNOSIS — R079 Chest pain, unspecified: Secondary | ICD-10-CM | POA: Diagnosis not present

## 2025-02-04 DIAGNOSIS — Z3492 Encounter for supervision of normal pregnancy, unspecified, second trimester: Secondary | ICD-10-CM | POA: Diagnosis not present

## 2025-02-04 DIAGNOSIS — R002 Palpitations: Secondary | ICD-10-CM | POA: Diagnosis not present

## 2025-02-04 LAB — BASIC METABOLIC PANEL WITH GFR
BUN/Creatinine Ratio: 10 (ref 9–23)
BUN: 6 mg/dL (ref 6–20)
CO2: 19 mmol/L — ABNORMAL LOW (ref 20–29)
Calcium: 9.3 mg/dL (ref 8.7–10.2)
Chloride: 104 mmol/L (ref 96–106)
Creatinine, Ser: 0.58 mg/dL (ref 0.57–1.00)
Glucose: 79 mg/dL (ref 70–99)
Potassium: 4.3 mmol/L (ref 3.5–5.2)
Sodium: 137 mmol/L (ref 134–144)
eGFR: 118 mL/min/{1.73_m2}

## 2025-02-04 LAB — MAGNESIUM: Magnesium: 1.8 mg/dL (ref 1.6–2.3)

## 2025-02-04 NOTE — Patient Instructions (Signed)
 Medication Instructions:  Your physician recommends that you continue on your current medications as directed. Please refer to the Current Medication list given to you today.  *If you need a refill on your cardiac medications before your next appointment, please call your pharmacy*  Lab Work: Today BMP, Mg2 If you have labs (blood work) drawn today and your tests are completely normal, you will receive your results only by: MyChart Message (if you have MyChart) OR A paper copy in the mail If you have any lab test that is abnormal or we need to change your treatment, we will call you to review the results.  Testing/Procedures: Your physician has requested that you have an Exercise Stress Echocardiogram.  Please arrive 15 minutes before your appointment time for registration.  This test will take approximately 1-1.5 hours to complete.  Instructions: Beta blockers should be held for 24 hours prior to testing.  Otherwise, you may take your medications the morning of the test (someone from the testing area will call you with these specific instructions). Light breakfast and/or lunch. Dress prepared to exercise. Please wear a 2-piece outfit (no dresses, overalls, etc.) and wear shoes appropriate for walking (must be closed to and heel). Do not wear cologne, perfume, aftershave or lotions (deodorant is allowed). If you use an inhaler, bring it with you to the test.  Please note: If anyone comes with you to the appointment, they will need to remain in the main lobby due to limited space in the testing area.  We also ask at that you not bring children with you during testing. Due to room size and safety concerns, children are not allowed in the testing rooms during exams. Our front office staff cannot provide observation of children in our lobby area while testing is being conducted.    Follow-Up: At [facility], you and your health needs are our priority.  As part of our continuing  mission to provide you with exceptional heart care, our providers are all part of one team.  This team includes your primary Cardiologist (physician) and Advanced Practice Providers or APPs (Physician Assistants and Nurse Practitioners) who all work together to provide you with the care you need, when you need it.  Your next appointment:   4-6 week(s)  Provider:   Emeline FORBES Calender, DO

## 2025-02-04 NOTE — Progress Notes (Signed)
 " Cardiology Office Note:  .   Date:  02/04/2025  ID:  Carrie Nunez, DOB [birth year], MRN [id number] PCP: Orlando Dwayne NOVAK, FNP  [facility] Providers Cardiologist:  Emeline FORBES Calender, DO    History of Present Illness: .     Discussed the use of AI scribe software for clinical note transcription with the patient, who gave verbal consent to proceed.  History of Present Illness Carrie Nunez is a 39 year old female who presents with palpitations and anxiety during pregnancy. She was initially seen by Mauri Nest.  Palpitations and tachycardia - Palpitations with heart rates up to 157-165 beats per minute, especially during anxiety or panic attacks - Heart rate range on 13-day monitor (01/14/25): 57-165 bpm, average 88 bpm - Heart rate feels elevated even when calm - Heart 'flutters' present during episodes - Symptoms exacerbated during recent norovirus infection, possibly related to dehydration - No prior similar symptoms during five previous pregnancies  Chest pain - Chest pain associated with episodes of palpitations and anxiety as well as exertion for example when carrying groceries in the house. Had a burning sensation  Anxiety and panic attacks - Significant anxiety described as 'terrible, terrible anxiety' escalating into panic attacks - Difficulty lowering heart rate during episodes - Concern about impact of symptoms on pregnancy and ability to care for other children - Symptoms are more severe and different compared to previous pregnancies  Pregnancy status - Currently [redacted] weeks pregnant - History of five previous pregnancies without similar symptoms - Concerned about the safety of the unborn child and potential impact on cardiac health  Symptom management and activity - Manages symptoms by staying hydrated, drinking Body Armor drinks, and using electrolyte packs - Hesitant to start medication due to concerns about pregnancy and potential side effects - Avoids physical  activity due to fear of symptom exacerbation  Diagnostic evaluation - Echocardiogram performed on 12/07/24 - 13-day heart monitor study on 01/14/25 - TSH within normal limits in October 2025          ROS: Remaining review of systems negative  Studies Reviewed: SABRA   EKG Interpretation Date/Time:  Thursday February 04 2025 09:06:27 EST Ventricular Rate:  122 PR Interval:  128 QRS Duration:  68 QT Interval:  318 QTC Calculation: 453 R Axis:   17  Text Interpretation: Sinus tachycardia Cannot rule out Anterior infarct , age undetermined  Abnormal ECG When compared with ECG of 23-Feb-2011 00:46,  HEART RATE INCREASED SINCE  Confirmed by Calender Emeline [phone]) on 02/04/2025 9:09:03 AM    Results Labs TSH (09/2024): Within normal limits  Diagnostic Echocardiogram (12/07/2024): Left ventricular function normal, no wall motion abnormalities, normal diastolic function, right ventricular function normal with normal size and pressure, no mitral regurgitation, no aortic regurgitation, trivial tricuspid regurgitation, trivial pulmonic regurgitation, no hemodynamically significant valvular abnormalities. Ambulatory cardiac monitor (13-day) (01/14/2025): Heart rate ranged from 57 to 165 bpm, average 88 bpm, predominantly sinus rhythm, premature atrial contractions and premature ventricular contractions present, no sustained arrhythmias. Risk Assessment/Calculations:             Physical Exam:   VS:  BP 110/64 (BP Location: Left Arm, Patient Position: Sitting, Cuff Size: Large)   Pulse (!) 122   Ht 5' 5 (1.651 m)   Wt 161 lb (73 kg)   SpO2 99%   BMI 26.79 kg/m    Wt Readings from Last 3 Encounters:  02/04/25 161 lb (73 kg)  12/10/24 150 lb 6.4 oz (68.2 kg)  10/30/24 143 lb 9.6 oz (65.1 kg)    GEN: Well nourished, well developed in no acute distress, anxious and tearful at times NECK: No JVD; No carotid bruits CARDIAC:  RRR, no murmurs, no rubs, no gallops RESPIRATORY:  Clear to  auscultation without rales, wheezing or rhonchi  ABDOMEN: Soft, non-tender, non-distended EXTREMITIES:  No edema; No deformity   ASSESSMENT AND PLAN: .    Assessment and Plan Assessment & Plan Palpitations with sinus tachycardia and rare PACs/PVCs  Intermittent palpitations with sinus tachycardia and ectopy, including PACs and PVCs. Heart rate ranges from 57 to 165 bpm, averaging 88 bpm. Sinus tachycardia likely due to anxiety, pregnancy, and possible dehydration. No sustained arrhythmias or dangerous rhythms on heart monitor. PACs and PVCs not dangerous unless frequent. - Encouraged hydration and electrolyte intake. - In her second trimester and therefore would consider starting low-dose propranolol if symptoms persist or worsen which can help with anxiety as well.  - Ordered basic metabolic panel and magnesium  level to check electrolytes.  Atypical chest pain Intermittent chest pain associated with anxiety and exertion described as burning. Not always present on exertion. EKG shows minor nonspecific ST changes with sinus tachycardia. Chest pain likely related to anxiety and not indicative of cardiac ischemia. - Ordered stress echo (would prefer an ETT but there are minor baseline ST changes which may affect interpretation)  Anxiety disorder complicating pregnancy Anxiety disorder exacerbated by pregnancy, contributing to palpitations and chest pain. Anxiety likely driving sinus tachycardia and ectopy. Conservative management preferred to avoid medication due to pregnancy. - Encouraged conservative management with hydration and electrolyte intake. - Discussed potential use of beta blockers if anxiety and palpitations become unmanageable.  Trivial tricuspid and pulmonic valve regurgitation Trivial regurgitation of tricuspid and pulmonic valves noted on echocardiogram. Likely related to pregnancy-induced volume retention. Not clinically significant. - Continue monitoring without  intervention.       Informed Consent   Shared Decision Making/Informed Consent The risks [chest pain, shortness of breath, cardiac arrhythmias, dizziness, blood pressure fluctuations, myocardial infarction, stroke/transient ischemic attack, and life-threatening complications (estimated to be 1 in 10,000)], benefits (risk stratification, diagnosing coronary artery disease, treatment guidance) and alternatives of a stress echocardiogram were discussed in detail with Ms. Koeneman and she agrees to proceed.       Follow up: 4-6 weeks  Signed, Emeline FORBES Calender, DO  02/04/2025 10:02 AM    [facility] "

## 2025-02-05 ENCOUNTER — Ambulatory Visit: Payer: Self-pay | Admitting: Internal Medicine

## 2025-02-05 NOTE — Progress Notes (Signed)
 Magnesium  is mildly reduced at 1.8, goal greater than 2.0.  Would advise patient picking up over-the-counter magnesium  glycinate 200 mg to take daily for the next week which may help with her symptoms.  This is considered safe in pregnancy.  Let me know if you have any questions.  Thank you, [name], DO

## 2025-03-12 ENCOUNTER — Ambulatory Visit: Admitting: Internal Medicine

## 2025-03-19 ENCOUNTER — Ambulatory Visit (HOSPITAL_COMMUNITY)

## 2025-03-19 ENCOUNTER — Other Ambulatory Visit (HOSPITAL_COMMUNITY)

## 2025-03-23 ENCOUNTER — Ambulatory Visit: Admitting: Internal Medicine
